# Patient Record
Sex: Male | Born: 1955
Health system: Southern US, Community
[De-identification: ages and names within clinical notes are randomized; demographics above are authoritative.]

## PROBLEM LIST (undated history)

## (undated) ENCOUNTER — Emergency Department (HOSPITAL_COMMUNITY): Admission: EM | Disposition: A | Discharge status: Home / Self Care | Payer: Self-pay

## (undated) DIAGNOSIS — I498 Other specified cardiac arrhythmias: Secondary | ICD-10-CM

## (undated) DIAGNOSIS — Z950 Presence of cardiac pacemaker: Secondary | ICD-10-CM

## (undated) DIAGNOSIS — H6690 Otitis media, unspecified, unspecified ear: Secondary | ICD-10-CM

## (undated) DIAGNOSIS — Z87891 Personal history of nicotine dependence: Secondary | ICD-10-CM

## (undated) DIAGNOSIS — M479 Spondylosis, unspecified: Secondary | ICD-10-CM

## (undated) DIAGNOSIS — F4310 Post-traumatic stress disorder, unspecified: Secondary | ICD-10-CM

## (undated) DIAGNOSIS — H7090 Unspecified mastoiditis, unspecified ear: Secondary | ICD-10-CM

## (undated) DIAGNOSIS — Z77122 Contact with and (suspected) exposure to noise: Secondary | ICD-10-CM

## (undated) HISTORY — DX: Unspecified mastoiditis, unspecified ear: H70.90

## (undated) HISTORY — DX: Contact with and (suspected) exposure to noise: Z77.122

## (undated) HISTORY — DX: Spondylosis, unspecified: M47.9

## (undated) HISTORY — DX: Otitis media, unspecified, unspecified ear: H66.90

## (undated) HISTORY — DX: Personal history of nicotine dependence: Z87.891

## (undated) HISTORY — DX: Post-traumatic stress disorder, unspecified: F43.10

## (undated) HISTORY — PX: OTHER SURGICAL HISTORY: SHX169

## (undated) HISTORY — DX: Other specified cardiac arrhythmias: I49.8

## (undated) HISTORY — DX: Presence of cardiac pacemaker: Z95.0

## (undated) HISTORY — PX: INNER EAR SURGERY: SHX679

---

## 1997-05-16 ENCOUNTER — Ambulatory Visit (HOSPITAL_COMMUNITY): Admission: RE | Admit: 1997-05-16 | Discharge: 1997-05-16 | Payer: Self-pay | Admitting: Family Medicine

## 1997-06-16 ENCOUNTER — Emergency Department (HOSPITAL_COMMUNITY): Admission: EM | Admit: 1997-06-16 | Discharge: 1997-06-16 | Payer: Self-pay | Admitting: Emergency Medicine

## 1997-06-22 ENCOUNTER — Emergency Department (HOSPITAL_COMMUNITY): Admission: EM | Admit: 1997-06-22 | Discharge: 1997-06-22 | Payer: Self-pay | Admitting: Emergency Medicine

## 1997-09-11 ENCOUNTER — Ambulatory Visit (HOSPITAL_COMMUNITY): Admission: RE | Admit: 1997-09-11 | Discharge: 1997-09-11 | Payer: Self-pay | Admitting: *Deleted

## 1997-09-14 ENCOUNTER — Ambulatory Visit (HOSPITAL_COMMUNITY): Admission: RE | Admit: 1997-09-14 | Discharge: 1997-09-14 | Payer: Self-pay | Admitting: *Deleted

## 1999-09-03 ENCOUNTER — Emergency Department (HOSPITAL_COMMUNITY): Admission: EM | Admit: 1999-09-03 | Discharge: 1999-09-03 | Payer: Self-pay | Admitting: Emergency Medicine

## 2001-05-22 ENCOUNTER — Encounter: Payer: Self-pay | Admitting: Internal Medicine

## 2001-05-22 ENCOUNTER — Emergency Department (HOSPITAL_COMMUNITY): Admission: EM | Admit: 2001-05-22 | Discharge: 2001-05-22 | Payer: Self-pay | Admitting: Internal Medicine

## 2003-02-14 ENCOUNTER — Encounter: Admission: RE | Admit: 2003-02-14 | Discharge: 2003-02-14 | Payer: Self-pay | Admitting: Family Medicine

## 2004-02-02 ENCOUNTER — Ambulatory Visit: Payer: Self-pay | Admitting: Internal Medicine

## 2004-05-08 ENCOUNTER — Ambulatory Visit: Payer: Self-pay | Admitting: Internal Medicine

## 2004-06-16 ENCOUNTER — Ambulatory Visit: Payer: Self-pay

## 2004-07-01 ENCOUNTER — Ambulatory Visit: Payer: Self-pay | Admitting: Internal Medicine

## 2004-09-08 ENCOUNTER — Ambulatory Visit: Payer: Self-pay | Admitting: Internal Medicine

## 2004-09-12 ENCOUNTER — Emergency Department (HOSPITAL_COMMUNITY): Admission: EM | Admit: 2004-09-12 | Discharge: 2004-09-12 | Payer: Self-pay | Admitting: Emergency Medicine

## 2004-09-12 ENCOUNTER — Ambulatory Visit: Payer: Self-pay

## 2005-02-23 ENCOUNTER — Encounter: Admission: RE | Admit: 2005-02-23 | Discharge: 2005-02-23 | Payer: Self-pay | Admitting: Orthopedic Surgery

## 2005-04-09 ENCOUNTER — Ambulatory Visit: Payer: Self-pay | Admitting: Internal Medicine

## 2005-05-25 ENCOUNTER — Ambulatory Visit: Payer: Self-pay | Admitting: Internal Medicine

## 2005-07-31 ENCOUNTER — Ambulatory Visit: Payer: Self-pay | Admitting: Internal Medicine

## 2005-08-10 ENCOUNTER — Ambulatory Visit: Payer: Self-pay | Admitting: Family Medicine

## 2006-05-31 ENCOUNTER — Ambulatory Visit: Payer: Self-pay | Admitting: Internal Medicine

## 2006-07-05 ENCOUNTER — Encounter (INDEPENDENT_AMBULATORY_CARE_PROVIDER_SITE_OTHER): Payer: Self-pay | Admitting: Otolaryngology

## 2006-07-05 ENCOUNTER — Ambulatory Visit (HOSPITAL_COMMUNITY): Admission: RE | Admit: 2006-07-05 | Discharge: 2006-07-06 | Payer: Self-pay | Admitting: Otolaryngology

## 2006-09-01 ENCOUNTER — Ambulatory Visit: Payer: Self-pay | Admitting: Internal Medicine

## 2007-02-08 ENCOUNTER — Ambulatory Visit: Payer: Self-pay | Admitting: Internal Medicine

## 2007-05-31 ENCOUNTER — Ambulatory Visit: Payer: Self-pay | Admitting: Internal Medicine

## 2007-09-12 ENCOUNTER — Emergency Department (HOSPITAL_COMMUNITY): Admission: EM | Admit: 2007-09-12 | Discharge: 2007-09-12 | Payer: Self-pay | Admitting: Family Medicine

## 2007-10-06 ENCOUNTER — Ambulatory Visit: Payer: Self-pay | Admitting: Internal Medicine

## 2008-01-23 ENCOUNTER — Ambulatory Visit: Payer: Self-pay | Admitting: Internal Medicine

## 2008-04-20 ENCOUNTER — Encounter (INDEPENDENT_AMBULATORY_CARE_PROVIDER_SITE_OTHER): Payer: Self-pay

## 2008-05-17 ENCOUNTER — Encounter: Payer: Self-pay | Admitting: Internal Medicine

## 2008-06-05 DIAGNOSIS — Z950 Presence of cardiac pacemaker: Secondary | ICD-10-CM | POA: Insufficient documentation

## 2008-06-05 DIAGNOSIS — H7090 Unspecified mastoiditis, unspecified ear: Secondary | ICD-10-CM | POA: Insufficient documentation

## 2008-06-05 DIAGNOSIS — M479 Spondylosis, unspecified: Secondary | ICD-10-CM | POA: Insufficient documentation

## 2008-06-05 DIAGNOSIS — I498 Other specified cardiac arrhythmias: Secondary | ICD-10-CM | POA: Insufficient documentation

## 2008-06-05 DIAGNOSIS — Z87891 Personal history of nicotine dependence: Secondary | ICD-10-CM | POA: Insufficient documentation

## 2008-06-14 ENCOUNTER — Ambulatory Visit: Payer: Self-pay | Admitting: Internal Medicine

## 2008-07-09 ENCOUNTER — Ambulatory Visit: Payer: Self-pay | Admitting: Internal Medicine

## 2008-07-09 DIAGNOSIS — G4700 Insomnia, unspecified: Secondary | ICD-10-CM | POA: Insufficient documentation

## 2008-08-13 ENCOUNTER — Ambulatory Visit: Payer: Self-pay | Admitting: Internal Medicine

## 2008-08-13 DIAGNOSIS — R05 Cough: Secondary | ICD-10-CM | POA: Insufficient documentation

## 2008-08-13 DIAGNOSIS — N401 Enlarged prostate with lower urinary tract symptoms: Secondary | ICD-10-CM | POA: Insufficient documentation

## 2008-08-13 DIAGNOSIS — R059 Cough, unspecified: Secondary | ICD-10-CM | POA: Insufficient documentation

## 2008-08-13 LAB — CONVERTED CEMR LAB
ALT: 27 units/L (ref 0–53)
Albumin: 4.6 g/dL (ref 3.5–5.2)
Alkaline Phosphatase: 46 units/L (ref 39–117)
Bilirubin Urine: NEGATIVE
Bilirubin, Direct: 0.2 mg/dL (ref 0.0–0.3)
Chloride: 105 meq/L (ref 96–112)
Creatinine, Ser: 0.9 mg/dL (ref 0.4–1.5)
Eosinophils Absolute: 0.1 10*3/uL (ref 0.0–0.7)
GFR calc non Af Amer: 93.92 mL/min (ref >60)
Hemoglobin: 16.7 g/dL (ref 13.0–17.0)
Ketones, ur: NEGATIVE mg/dL
LDL Cholesterol: 103 mg/dL — ABNORMAL HIGH (ref 0–99)
Lymphocytes Relative: 19.6 % (ref 12.0–46.0)
Lymphs Abs: 1.9 10*3/uL (ref 0.7–4.0)
MCHC: 35.3 g/dL (ref 30.0–36.0)
MCV: 94.7 fL (ref 78.0–100.0)
Monocytes Relative: 8.3 % (ref 3.0–12.0)
Neutro Abs: 7 10*3/uL (ref 1.4–7.7)
Neutrophils Relative %: 70.8 % (ref 43.0–77.0)
Specific Gravity, Urine: 1.025 (ref 1.000–1.030)
Total CHOL/HDL Ratio: 3
Total Protein: 7.3 g/dL (ref 6.0–8.3)
Urine Glucose: NEGATIVE mg/dL
Urobilinogen, UA: 0.2 (ref 0.0–1.0)

## 2008-08-14 ENCOUNTER — Encounter: Payer: Self-pay | Admitting: Internal Medicine

## 2008-09-21 ENCOUNTER — Encounter: Payer: Self-pay | Admitting: Internal Medicine

## 2008-11-13 ENCOUNTER — Encounter (INDEPENDENT_AMBULATORY_CARE_PROVIDER_SITE_OTHER): Payer: Self-pay | Admitting: *Deleted

## 2008-11-18 ENCOUNTER — Encounter: Payer: Self-pay | Admitting: Internal Medicine

## 2008-11-19 ENCOUNTER — Ambulatory Visit: Payer: Self-pay | Admitting: Internal Medicine

## 2008-11-29 ENCOUNTER — Encounter: Payer: Self-pay | Admitting: Internal Medicine

## 2009-01-22 ENCOUNTER — Encounter (INDEPENDENT_AMBULATORY_CARE_PROVIDER_SITE_OTHER): Payer: Self-pay | Admitting: *Deleted

## 2009-03-08 ENCOUNTER — Encounter: Payer: Self-pay | Admitting: Internal Medicine

## 2009-03-25 ENCOUNTER — Encounter: Payer: Self-pay | Admitting: Internal Medicine

## 2009-06-14 ENCOUNTER — Ambulatory Visit: Payer: Self-pay | Admitting: Internal Medicine

## 2009-09-13 ENCOUNTER — Encounter (INDEPENDENT_AMBULATORY_CARE_PROVIDER_SITE_OTHER): Payer: Self-pay | Admitting: *Deleted

## 2010-01-08 ENCOUNTER — Encounter: Payer: Self-pay | Admitting: Internal Medicine

## 2010-01-14 ENCOUNTER — Encounter: Payer: Self-pay | Admitting: Internal Medicine

## 2010-02-11 NOTE — Letter (Signed)
Summary: Device-Delinquent Phone Journalist, newspaper, Main Office  [address]   [place], [place] [address]   Phone: [phone]  Fax: [phone]     March 25, 2009 MRN: [id number]   [facility] [address] [place], [place]  [address]   Dear [name],  According to our records, you were scheduled for a device phone transmission on February 20, 2009.     We did not receive any results from this check.  If you transmitted on your scheduled day, please call us to help troubleshoot your system.  If you forgot to send your transmission, please send one upon receipt of this letter.  Thank you,   Architectural technologist [facility]

## 2010-02-11 NOTE — Letter (Signed)
Summary: Device-Delinquent Phone Journalist, newspaper, Main Office  [address]   [place], [place] [address]   Phone: [phone]  Fax: [phone]     September 13, 2009 MRN: [id number]   Nicholas [name] [address] [place], [place]  [address]   Dear Mr. TORAL,  According to our records, you were scheduled for a device phone transmission on 09/12/09.                             .     We did not receive any results from this check.  If you transmitted on your scheduled day, please call us to help troubleshoot your system.  If you forgot to send your transmission, please send one upon receipt of this letter.  Thank you,  Altha Harm, LPN  September 13, 2009 3:50 PM  [facility]

## 2010-02-11 NOTE — Letter (Signed)
Summary: Referral - not able to see patient  [facility]  [address] [place], [place] [address]   Phone: [phone]  Fax: [phone]        January 22, 2009   Re:   Nicholas [name] DOB:  25-Mar-1955 MRN:   [id number]    Dear Dr. Sanda Linger:  Thank you for your kind referral of the above patient.  We have attempted to schedule the recommended procedure for a Screening Colonscopy but have not been able to schedule because:   X  The patient was not available by phone and/or has not returned our calls.  ___ The patient declined to schedule the procedure at this time.  We appreciate the referral and hope that we will have the opportunity to treat this patient in the future.    Sincerely,  [facility] [phone]

## 2010-02-11 NOTE — Cardiovascular Report (Signed)
Summary: Office Visit   [facility]   Imported By: Roderic Ovens 06/25/2009 10:55:22  _____________________________________________________________________  External Attachment:    Type:   Image     Comment:   External Document

## 2010-02-11 NOTE — Assessment & Plan Note (Signed)
Summary: pacer check.[email or web address].amber   Visit Type:  Follow-up Primary Provider:  Etta Grandchild MD   History of Present Illness: Mr. Telford returns today for PPM followup.  He has a h/o syncope and bradycardia and underwent PPM at [facility]  several yrs ago.  He has been stable.  He denies c/p or sob.  He does have cough and HA.  No fever, chills, or night sweats.  Current Medications (verified): 1)  Trazodone Hcl 50 Mg Tabs (Trazodone Hcl) .... One By Mouth Qhs For Insomnia  Allergies (verified): No Known Drug Allergies  Past History:  Past Medical History: Last updated: 06/05/2008 TOBACCO ABUSE, HX OF (ICD-V15.82) EXPOSURE TO NOISE (ICD-E928.1) SPONDYLOSIS UNSPEC SITE W/O MENTION MYELOPATHY (ICD-721.90) BRADYCARDIA (ICD-427.89) CARDIAC PACEMAKER IN SITU (ICD-V45.01) MASTOIDITIS (ICD-383.9) OTITIS MEDIA (ICD-382.9) PTSD  Past Surgical History: Last updated: 10/19/2006 Permanent pacemaker  Vital Signs:  Patient profile:   55 year old male Height:      72 inches Weight:      140 pounds BMI:     19.06 Pulse rate:   72 / minute BP sitting:   122 / 70  (left arm)  Vitals Entered By: Laurance Flatten CMA (June 14, 2009 10:49 AM)  Physical Exam  General:  alert, well-developed, well-nourished, well-hydrated, healthy-appearing, cooperative to examination, and good hygiene.   Head:  normocephalic and atraumatic.   Eyes:  vision grossly intact, pupils equal, and pupils round.   Ears:  R ear normal and L ear normal.   Mouth:  Oral mucosa and oropharynx without lesions or exudates.  Teeth in good repair. Neck:  supple, full ROM, no masses, normal carotid upstroke, no carotid bruits, no cervical lymphadenopathy, and no neck tenderness.   Chest Wall:  Well healed PPM incision. Lungs:  Normal respiratory effort, chest expands symmetrically. Lungs are clear to auscultation, no crackles or wheezes. Heart:  Normal rate and regular rhythm. S1 and S2 normal without gallop, murmur,  click, rub or other extra sounds. Abdomen:  Bowel sounds positive,abdomen soft and non-tender without masses, organomegaly or hernias noted. Msk:  No deformity or scoliosis noted of thoracic or lumbar spine.   Pulses:  R and L carotid,radial,femoral,dorsalis pedis and posterior tibial pulses are full and equal bilaterally Extremities:  No clubbing, cyanosis, edema, or deformity noted with normal full range of motion of all joints.   Neurologic:  Alert and oriented x 3.   PPM Specifications Following MD:  Lewayne Bunting, MD     PPM Vendor:  Medtronic     PPM Model Number:  [id number]     PPM Serial Number:  [id number] PPM DOI:  06/07/2001     PPM Implanting MD:  Lewayne Bunting, MD  Lead 1    Location: RA     DOI: 06/07/2001     Model #: [id number]     Serial #: [id number]     Status: active Lead 2    Location: RV     DOI: 06/07/2001     Model #: [id number]     Serial #: [id number]     Status: active   Indications:  Bradycardia   PPM Follow Up Battery Voltage:  2.72 V     Battery Est. Longevity:  25 MTHS     Pacer Dependent:  No       PPM Device Measurements Atrium  Amplitude: 5.60 mV, Impedance: 444 ohms, Threshold: 0.50 V at 0.40 msec Right Ventricle  Amplitude: 11.20 mV, Impedance: 592 ohms, Threshold: 0.50 V  at 0.40 msec  Episodes MS Episodes:  0     Percent Mode Switch:  0     Ventricular High Rate:  42     Atrial Pacing:  3.9%     Ventricular Pacing:  <0.1%  Parameters Mode:  DDDR     Lower Rate Limit:  40     Upper Rate Limit:  130 Paced AV Delay:  250     Sensed AV Delay:  230 Tech Comments:  42 VHR EPISODES IN OCT-NOV 2010--HUNTING SEASON.  MOST EPISODES 1:1.  NORMAL SENSING AND THRESHOLD TESTING.  BATTERY LONGEVITY 25 MTHS.  NO CHANGES MADE.  Vella Kohler  June 14, 2009 11:16 AM MD Comments:  Agree with above.  Likely sinus tachycardia.  Impression & Recommendations:  Problem # 1:  CARDIAC PACEMAKER IN SITU (ICD-V45.01) His PPM is working normally.  He is about 2 yrs out from PM  generator change.  Problem # 2:  TOBACCO ABUSE, HX OF (ICD-V15.82) I have encouraged him to stop smoking.    Problem # 3:  INSOMNIA (ICD-780.52) this appears to be improved.  Patient Instructions: 1)  Your physician recommends that you schedule a follow-up appointment in: 12 months with Dr Ladona Ridgel

## 2010-02-11 NOTE — Letter (Signed)
Summary: Device-Delinquent Phone Journalist, newspaper, Main Office  [address]   [place], [place] [address]   Phone: [phone]  Fax: [phone]     March 08, 2009 MRN: [id number]   KAIYDEN SIMKIN [address] [place], [place]  [address]   Dear Mr. MARINELLO,  According to our records, you were scheduled for a device phone transmission on    February 20, 2009.     We did not receive any results from this check.  If you transmitted on your scheduled day, please call us to help troubleshoot your system.  If you forgot to send your transmission, please send one upon receipt of this letter.  Thank you,   Architectural technologist [facility]

## 2010-02-13 NOTE — Letter (Signed)
Summary: Device-Delinquent Phone Journalist, newspaper, Main Office  [address]   [place], [place] [address]   Phone: [phone]  Fax: [phone]     January 08, 2010 MRN: [id number]   CAYLEN YARDLEY [address] [place], [place]  [address]   Dear Mr. LACOCK,  According to our records, you were scheduled for a device phone transmission on  09-12-2009.     We did not receive any results from this check.  If you transmitted on your scheduled day, please call us to help troubleshoot your system.  If you forgot to send your transmission, please send one upon receipt of this letter.  Thank you,  Vella Kohler  January 08, 2010 3:20 PM   [facility] certified

## 2010-02-13 NOTE — Cardiovascular Report (Signed)
Summary: Certified Letter Signed - Patient (No Appt. No Response)  Certified Letter Signed - Patient (No Appt. No Response)   Imported By: Debby Freiberg 02/07/2010 12:41:03  _____________________________________________________________________  External Attachment:    Type:   Image     Comment:   External Document
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
## 2010-05-27 NOTE — Assessment & Plan Note (Signed)
[facility]                         ELECTROPHYSIOLOGY OFFICE NOTE   NAME:Nicholas [name], Nicholas [name]                         MRN:          [id number]  DATE:05/31/2007                            DOB:          08-14-1955    Nicholas [name] returns today for followup.  He is a very pleasant middle-aged  man with symptomatic bradycardia thought secondary to a neurally  mediated mechanism for which he underwent a permanent pacemaker  insertion approximately 6 years ago.  He returns today for followup.  He  has been stable.  He does note that he still has discomfort at the  pacemaker insertion site but otherwise has had no specific complaints  from the device except that he still cannot stand to look at it or  touch it.   CURRENT MEDICATIONS:  He is on no medicines.   PHYSICAL EXAMINATION:  GENERAL:  He is a pleasant, well-appearing middle-  aged man in no distress.  VITAL SIGNS:  Blood pressure was 104/71, the pulse 70 and regular,  respirations were 18.  Weight was 148 pounds.  NECK:  No jugular venous distention.  LUNGS:  Clear bilaterally to auscultation.  No wheezes, rales or rhonchi  are present.  CARDIOVASCULAR:  Regular rate and rhythm.  Normal S1-S2.  EXTREMITIES:  No cyanosis, clubbing or edema.   Interrogation of his pacemaker demonstrates a Medtronic Kappa 900.  P  waves were greater than 5, R waves were 11.  The impedance was 438 in  the atrium, 660 in the ventricle.  Threshold was 0.5 at 0.4 both in the  right atrium and right ventricle.  Battery voltage was 2.76 volts.  Estimated longevity was 3-1/2 years.  He had 4 mode switching episodes,  the lower rate was 40 per minute.  The patient was A pacing 3% of the  time, V pacing less than 0.1% of the time.   IMPRESSION:  1. Symptomatic bradycardia.  2. Neurally mediated syncope.  3. Status post pacemaker insertion.   DISCUSSION:  Overall, Nicholas [name] is stable.  His pacemaker is working  normally.  Will see  him back for pacemaker followup in 1 years.     Doylene Canning. Ladona Ridgel, MD  Electronically Signed    GWT/MedQ  DD: 05/31/2007  DT: 05/31/2007  Job #: [id number]
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
## 2010-06-19 ENCOUNTER — Encounter: Payer: Self-pay | Admitting: Internal Medicine

## 2010-07-24 ENCOUNTER — Ambulatory Visit (INDEPENDENT_AMBULATORY_CARE_PROVIDER_SITE_OTHER): Payer: BC Managed Care – PPO | Admitting: Internal Medicine

## 2010-07-24 ENCOUNTER — Encounter: Payer: Self-pay | Admitting: Internal Medicine

## 2010-07-24 DIAGNOSIS — Z950 Presence of cardiac pacemaker: Secondary | ICD-10-CM

## 2010-07-24 DIAGNOSIS — I498 Other specified cardiac arrhythmias: Secondary | ICD-10-CM

## 2010-07-24 LAB — PACEMAKER DEVICE OBSERVATION
AL IMPEDENCE PM: 543 Ohm
AL THRESHOLD: 0.5 V
ATRIAL PACING PM: 5
RV LEAD THRESHOLD: 0.5 V
VENTRICULAR PACING PM: 0

## 2010-07-24 NOTE — Progress Notes (Signed)
HPI Mr. Tappan returns today for followup. He is a pleasant 55 yo man with a h/o neurally mediated syncope, s/p PPM. The patient has had ongoing pain at his PPM insertion site. No additional syncope. No c/p. He has multiple chronic complaints including HA, multiple arthritic complaints and generalized fatigue.  No Known Allergies   No current outpatient prescriptions on file.     Past Medical History  Diagnosis Date  . Personal history of tobacco use, presenting hazards to health   . Exposure to noise   . Spondylosis of unspecified site without mention of myelopathy   . Other specified cardiac dysrhythmias     bradycardia  . Cardiac pacemaker in situ   . Unspecified mastoiditis   . Unspecified otitis media   . PTSD (post-traumatic stress disorder)     ROS:   All systems reviewed and negative except as noted in the HPI.   Past Surgical History  Procedure Date  . Permanent pacemaker      No family history on file.   History   Social History  . Marital Status: Married    Spouse Name: N/A    Number of Children: N/A  . Years of Education: N/A   Occupational History  . Not on file.   Social History Main Topics  . Smoking status: Smoker, Current Status Unknown  . Smokeless tobacco: Not on file  . Alcohol Use: No  . Drug Use: No  . Sexually Active: Not on file   Other Topics Concern  . Not on file   Social History Narrative   Married, does not get regular exercise. 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 Has a HS education.       BP 108/65  Pulse 70  Resp 18  Ht 6' (1.829 m)  Wt 142 lb 1.9 oz (64.465 kg)  BMI 19.27 kg/m2  Physical Exam:  Well appearing NAD HEENT: Unremarkable Neck:  No JVD, no thyromegally Lymphatics:  No adenopathy Back:  No CVA tenderness Lungs:  Clear. Well healed PPM incision. HEART:  Regular rate rhythm, no murmurs, no rubs, no clicks Abd:  soft, positive bowel sounds, no organomegally, no  rebound, no guarding Ext:  2 plus pulses, no edema, no cyanosis, no clubbing Skin:  No rashes no nodules Neuro:  CN II through XII intact, motor grossly intact  DEVICE  Normal device function.  See PaceArt for details.   Assess/Plan:

## 2010-07-24 NOTE — Assessment & Plan Note (Signed)
His device is working normally. He has a year of battery left on his device. He is unsure if he will have it changed out.

## 2010-07-24 NOTE — Patient Instructions (Signed)
Your physician wants you to follow-up in: 12 months Dr Taylor You will receive a reminder letter in the mail two months in advance. If you don't receive a letter, please call our office to schedule the follow-up appointment.  Remote monitoring is used to monitor your Pacemaker of ICD from home. This monitoring reduces the number of office visits required to check your device to one time per year. It allows us to keep an eye on the functioning of your device to ensure it is working properly. You are scheduled for a device check from home on 10/23/2010. You may send your transmission at any time that day. If you have a wireless device, the transmission will be sent automatically. After your physician reviews your transmission, you will receive a postcard with your next transmission date.   

## 2010-10-23 ENCOUNTER — Encounter: Payer: BC Managed Care – PPO | Admitting: *Deleted

## 2010-10-27 ENCOUNTER — Encounter: Payer: Self-pay | Admitting: *Deleted

## 2010-10-29 LAB — DIFFERENTIAL
Eosinophils Absolute: 0.1
Eosinophils Relative: 1
Lymphocytes Relative: 24
Lymphs Abs: 2.1
Monocytes Absolute: 0.8 — ABNORMAL HIGH
Monocytes Relative: 9
Neutrophils Relative %: 66

## 2010-10-29 LAB — PROTIME-INR
INR: 1.1
Prothrombin Time: 14.7

## 2010-10-29 LAB — COMPREHENSIVE METABOLIC PANEL
Albumin: 4.5
BUN: 5 — ABNORMAL LOW
GFR calc Af Amer: >60
Glucose, Bld: 84
Potassium: 4.3
Total Protein: 6.6

## 2010-10-29 LAB — CBC
HCT: 44.9
Hemoglobin: 15.2
RDW: 12.8

## 2010-10-29 LAB — URINALYSIS, ROUTINE W REFLEX MICROSCOPIC
Ketones, ur: NEGATIVE
Leukocytes, UA: NEGATIVE
Protein, ur: NEGATIVE
Urobilinogen, UA: 0.2
pH: 7.5

## 2011-07-22 ENCOUNTER — Emergency Department (HOSPITAL_COMMUNITY)
Admission: EM | Admit: 2011-07-22 | Discharge: 2011-07-22 | Disposition: A | Discharge status: Home / Self Care | Payer: BC Managed Care – PPO | Source: Home / Self Care | Attending: Emergency Medicine | Admitting: Emergency Medicine

## 2011-07-22 ENCOUNTER — Encounter (HOSPITAL_COMMUNITY): Payer: Self-pay | Admitting: Emergency Medicine

## 2011-07-22 DIAGNOSIS — T148XXA Other injury of unspecified body region, initial encounter: Secondary | ICD-10-CM

## 2011-07-22 DIAGNOSIS — W57XXXA Bitten or stung by nonvenomous insect and other nonvenomous arthropods, initial encounter: Secondary | ICD-10-CM

## 2011-07-22 DIAGNOSIS — I951 Orthostatic hypotension: Secondary | ICD-10-CM

## 2011-07-22 DIAGNOSIS — T148 Other injury of unspecified body region: Secondary | ICD-10-CM

## 2011-07-22 LAB — COMPREHENSIVE METABOLIC PANEL
ALT: 24 U/L (ref 0–53)
AST: 27 U/L (ref 0–37)
Albumin: 4.6 g/dL (ref 3.5–5.2)
CO2: 26 mEq/L (ref 19–32)
Creatinine, Ser: 0.81 mg/dL (ref 0.50–1.35)
Glucose, Bld: 94 mg/dL (ref 70–99)
Potassium: 4.3 mEq/L (ref 3.5–5.1)
Total Bilirubin: 0.6 mg/dL (ref 0.3–1.2)

## 2011-07-22 LAB — CBC WITH DIFFERENTIAL/PLATELET
Basophils Absolute: 0.1 10*3/uL (ref 0.0–0.1)
Basophils Relative: 1 % (ref 0–1)
Lymphs Abs: 2.1 10*3/uL (ref 0.7–4.0)
MCH: 32.3 pg (ref 26.0–34.0)
MCV: 93.2 fL (ref 78.0–100.0)
Monocytes Relative: 11 % (ref 3–12)
RBC: 4.7 MIL/uL (ref 4.22–5.81)
RDW: 13.1 % (ref 11.5–15.5)
WBC: 9.5 10*3/uL (ref 4.0–10.5)

## 2011-07-22 LAB — POCT URINALYSIS DIP (DEVICE)
Ketones, ur: NEGATIVE mg/dL
Protein, ur: NEGATIVE mg/dL
pH: 6 (ref 5.0–8.0)

## 2011-07-22 MED ORDER — DOXYCYCLINE HYCLATE 100 MG PO TABS: 100.0000 mg | ORAL_TABLET | Freq: Two times a day (BID) | ORAL | Status: AC

## 2011-07-22 NOTE — ED Notes (Signed)
Onset 2 weeks ago of feeling bad.  Denies any uri, denies fever, denies weight loss.  Patient is eating and drinking like usual.  Denies any diarrhea, no vomiting.  Patient describes a gradual increasing weakness, dizziness, just "feeling bad". Intermittent nausea.   Patient reports removing several ticks over the season.  Patient removed one from left lower leg, firm and very red around wound.

## 2011-07-22 NOTE — ED Provider Notes (Signed)
Medical screening examination/treatment/procedure(s) were performed by a resident physician and as supervising physician I was immediately available for consultation/collaboration.  Additionally, I saw the patient independently, verified the history, examined the patient and discussed the treatment plan with the resident.  Leslee Home, M.D.    Reuben Likes, MD 07/22/11 2114

## 2011-07-22 NOTE — ED Provider Notes (Signed)
Medical screening examination/treatment/procedure(s) were performed by a resident physician and as supervising physician I was immediately available for consultation/collaboration.  I talked with the patient and examined him.  I concur with the treatment as outlined below.   Leslee Home, M.D.   Reuben Likes, MD 07/22/11 [id number]
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
## 2011-07-28 ENCOUNTER — Encounter: Payer: Self-pay | Admitting: Internal Medicine

## 2011-07-28 ENCOUNTER — Encounter: Payer: Self-pay | Admitting: *Deleted

## 2011-07-28 ENCOUNTER — Ambulatory Visit (INDEPENDENT_AMBULATORY_CARE_PROVIDER_SITE_OTHER): Payer: BC Managed Care – PPO | Admitting: Internal Medicine

## 2011-07-28 VITALS — BP 120/70 | HR 72 | Ht 72.0 in | Wt 143.0 lb

## 2011-07-28 DIAGNOSIS — Z950 Presence of cardiac pacemaker: Secondary | ICD-10-CM

## 2011-07-28 DIAGNOSIS — I498 Other specified cardiac arrhythmias: Secondary | ICD-10-CM

## 2011-07-28 LAB — PACEMAKER DEVICE OBSERVATION
BATTERY VOLTAGE: 2.58 v
BMOD-0003RV: 30
BMOD-0005RV: 95 {beats}/min
BRDY-0002RV: 65 {beats}/min
RV LEAD AMPLITUDE: 8 mv
RV LEAD IMPEDENCE PM: 576 Ohm
RV LEAD THRESHOLD: 0.5 v

## 2011-07-28 NOTE — Progress Notes (Signed)
HPI [name] returns today for followup. He is a 55-year-old man with a history of symptomatic bradycardia thought secondary to autonomic dysfunction. He underwent permanent pacemaker insertion at [facility] just over 10 years ago. The patient has not paced in any significant amount since then. He is now reached elective replacement. For several years after his pacemaker was placed, he had problems excepting that there was a foreign body inside of him. He has gradually gotten better with this concept. He denies syncope or peripheral edema. He is working. No Known Allergies   Current Outpatient Prescriptions  Medication Sig Dispense Refill  . doxycycline (VIBRA-TABS) 100 MG tablet Take 1 tablet (100 mg total) by mouth 2 (two) times daily.  14 tablet  0     Past Medical History  Diagnosis Date  . Personal history of tobacco use, presenting hazards to health   . Exposure to noise   . Spondylosis of unspecified site without mention of myelopathy   . Other specified cardiac dysrhythmias     bradycardia  . Cardiac pacemaker in situ   . Unspecified mastoiditis   . Unspecified otitis media   . PTSD (post-traumatic stress disorder)     ROS:   All systems reviewed and negative except as noted in the HPI.   Past Surgical History  Procedure Date  . Permanent pacemaker   . Inner ear surgery      No family history on file.   History   Social History  . Marital Status: Married    Spouse Name: N/A    Number of Children: N/A  . Years of Education: N/A   Occupational History  . Not on file.   Social History Main Topics  . Smoking status: Smoker, Current Status Unknown  . Smokeless tobacco: Not on file  . Alcohol Use: Yes  . Drug Use: No  . Sexually Active: Not on file   Other Topics Concern  . Not on file   Social History Narrative   Married, does not get regular exercise. 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 Has a  HS education.       BP 120/70  Pulse 72  Ht 6' (1.829 m)  Wt 143 lb (64.864 kg)  BMI 19.39 kg/m2  Physical Exam:  Well appearing NAD HEENT: Unremarkable Neck:  No JVD, no thyromegally Lungs:  Clear with no wheezes, rales, or rhonchi. HEART:  Regular rate rhythm, no murmurs, no rubs, no clicks Abd:  soft, positive bowel sounds, no organomegally, no rebound, no guarding Ext:  2 plus pulses, no edema, no cyanosis, no clubbing Skin:  No rashes no nodules Neuro:  CN II through XII intact, motor grossly intact  DEVICE  Normal device function.  See PaceArt for details. Medtronic pacemaker at [facility]  Assess/Plan:   

## 2011-07-28 NOTE — Patient Instructions (Addendum)
Your physician has recommended that you have a pacemaker removed.

## 2011-07-28 NOTE — Assessment & Plan Note (Signed)
The patient's pacemaker is at elective replacement. We discussed to treatment options. One option would be to remove the current device in place a new device. A second option would be to remove the device, The leads, and hold off on permanent pacemaker insertion until the patient demonstrated a clear-cut need to have the device in place. The risk, benefits, goals, and expectations of both approaches were discussed with the patient. For now, we will plan to remove the pacemaker and cap the leads, though he may change his mind in the future.

## 2011-07-30 ENCOUNTER — Other Ambulatory Visit: Payer: Self-pay | Admitting: *Deleted

## 2011-07-30 DIAGNOSIS — R001 Bradycardia, unspecified: Secondary | ICD-10-CM

## 2011-07-30 DIAGNOSIS — Z45018 Encounter for adjustment and management of other part of cardiac pacemaker: Secondary | ICD-10-CM

## 2011-08-03 ENCOUNTER — Encounter (HOSPITAL_COMMUNITY): Payer: Self-pay | Admitting: Pharmacy Technician

## 2011-08-05 ENCOUNTER — Other Ambulatory Visit (INDEPENDENT_AMBULATORY_CARE_PROVIDER_SITE_OTHER): Payer: BC Managed Care – PPO

## 2011-08-05 DIAGNOSIS — I498 Other specified cardiac arrhythmias: Secondary | ICD-10-CM

## 2011-08-05 DIAGNOSIS — R001 Bradycardia, unspecified: Secondary | ICD-10-CM

## 2011-08-05 DIAGNOSIS — Z45018 Encounter for adjustment and management of other part of cardiac pacemaker: Secondary | ICD-10-CM

## 2011-08-05 LAB — CBC WITH DIFFERENTIAL/PLATELET
Basophils Relative: 0.3 % (ref 0.0–3.0)
Eosinophils Absolute: 0 10*3/uL (ref 0.0–0.7)
Lymphocytes Relative: 15.1 % (ref 12.0–46.0)
MCHC: 33.4 g/dL (ref 30.0–36.0)
MCV: 96 fl (ref 78.0–100.0)
Monocytes Absolute: 1.2 10*3/uL — ABNORMAL HIGH (ref 0.1–1.0)
Neutrophils Relative %: 75.2 % (ref 43.0–77.0)
Platelets: 212 10*3/uL (ref 150.0–400.0)
RBC: 4.34 Mil/uL (ref 4.22–5.81)
WBC: 13.3 10*3/uL — ABNORMAL HIGH (ref 4.5–10.5)

## 2011-08-05 LAB — BASIC METABOLIC PANEL
Chloride: 101 mEq/L (ref 96–112)
GFR: 97.88 mL/min (ref >60.00)
Potassium: 3.6 mEq/L (ref 3.5–5.1)
Sodium: 136 mEq/L (ref 135–145)

## 2011-08-11 MED ORDER — SODIUM CHLORIDE 0.9 % IR SOLN
80.0000 mg | Status: DC
  Filled 2011-08-11 (×2): qty 2

## 2011-08-11 MED ORDER — SODIUM CHLORIDE 0.9 % IV SOLN
250.0000 mL | INTRAVENOUS | Status: DC
  Administered 2011-08-12: 20 mL via INTRAVENOUS

## 2011-08-11 MED ORDER — CHLORHEXIDINE GLUCONATE 4 % EX LIQD
60.0000 mL | Freq: Once | CUTANEOUS | Status: DC
  Filled 2011-08-11: qty 60

## 2011-08-11 MED ORDER — SODIUM CHLORIDE 0.9 % IJ SOLN: 3.0000 mL | INTRAMUSCULAR | Status: DC | PRN

## 2011-08-11 MED ORDER — CEFAZOLIN SODIUM-DEXTROSE 2-3 GM-% IV SOLR
2.0000 g | INTRAVENOUS | Status: DC
  Filled 2011-08-11 (×2): qty 50

## 2011-08-11 MED ORDER — SODIUM CHLORIDE 0.9 % IJ SOLN: 3.0000 mL | Freq: Two times a day (BID) | INTRAMUSCULAR | Status: DC

## 2011-08-12 ENCOUNTER — Ambulatory Visit (HOSPITAL_COMMUNITY)
Admission: RE | Admit: 2011-08-12 | Discharge: 2011-08-12 | Disposition: A | Discharge status: Home / Self Care | Payer: BC Managed Care – PPO | Source: Ambulatory Visit | Attending: Internal Medicine | Admitting: Internal Medicine

## 2011-08-12 ENCOUNTER — Encounter (HOSPITAL_COMMUNITY)
Admission: RE | Disposition: A | Discharge status: Home / Self Care | Payer: Self-pay | Source: Ambulatory Visit | Attending: Internal Medicine

## 2011-08-12 ENCOUNTER — Ambulatory Visit (HOSPITAL_COMMUNITY): Payer: BC Managed Care – PPO

## 2011-08-12 DIAGNOSIS — Z950 Presence of cardiac pacemaker: Secondary | ICD-10-CM

## 2011-08-12 DIAGNOSIS — Z45018 Encounter for adjustment and management of other part of cardiac pacemaker: Secondary | ICD-10-CM

## 2011-08-12 DIAGNOSIS — R001 Bradycardia, unspecified: Secondary | ICD-10-CM

## 2011-08-12 HISTORY — PX: GENERATOR REMOVAL: SHX5468

## 2011-08-12 SURGERY — REMOVAL, PULSE GENERATOR, ICD
Anesthesia: LOCAL

## 2011-08-12 MED ORDER — MIDAZOLAM HCL 5 MG/5ML IJ SOLN
INTRAMUSCULAR | Status: AC
  Filled 2011-08-12: qty 5

## 2011-08-12 MED ORDER — FENTANYL CITRATE 0.05 MG/ML IJ SOLN
INTRAMUSCULAR | Status: AC
  Filled 2011-08-12: qty 2

## 2011-08-12 MED ORDER — CEFAZOLIN SODIUM 1-5 GM-% IV SOLN
INTRAVENOUS | Status: AC
  Filled 2011-08-12: qty 100

## 2011-08-12 MED ORDER — ACETAMINOPHEN 325 MG PO TABS
325.0000 mg | ORAL_TABLET | ORAL | Status: DC | PRN
  Filled 2011-08-12: qty 2

## 2011-08-12 MED ORDER — ONDANSETRON HCL 4 MG/2ML IJ SOLN: 4.0000 mg | Freq: Four times a day (QID) | INTRAMUSCULAR | Status: DC | PRN

## 2011-08-12 MED ORDER — LIDOCAINE HCL (PF) 1 % IJ SOLN
INTRAMUSCULAR | Status: AC
  Filled 2011-08-12: qty 60

## 2011-08-12 MED ORDER — HYDROCODONE-ACETAMINOPHEN 5-325 MG PO TABS
1.0000 | ORAL_TABLET | ORAL | Status: DC | PRN
  Administered 2011-08-12: 2 via ORAL
  Filled 2011-08-12: qty 2

## 2011-08-12 MED ORDER — MUPIROCIN 2 % EX OINT
TOPICAL_OINTMENT | Freq: Two times a day (BID) | CUTANEOUS | Status: DC
  Filled 2011-08-12: qty 22

## 2011-08-12 MED ORDER — MUPIROCIN 2 % EX OINT
TOPICAL_OINTMENT | CUTANEOUS | Status: AC
  Filled 2011-08-12: qty 22

## 2011-08-12 NOTE — Interval H&P Note (Signed)
History and Physical Interval Note: Since prior clinic visit no change. Because he has not done much pacing, has a narrow QRS and PR interval, no indication to replace PPM.   08/12/2011 8:23 AM  Nicholas [name]  has presented today for surgery, with the diagnosis of eol  The various methods of treatment have been discussed with the patient and family. After consideration of risks, benefits and other options for treatment, the patient has consented to  Procedure(s) (LRB): GENERATOR REMOVAL (N/A) as a surgical intervention .  The patient's history has been reviewed, patient examined, no change in status, stable for surgery.  I have reviewed the patient's chart and labs.  Questions were answered to the patient's satisfaction.     Lewayne Bunting

## 2011-08-12 NOTE — Op Note (Signed)
DDD PPM removed and PPM leads placed under the subpectoralis muscle without immediate complication. Z#[id number].

## 2011-08-12 NOTE — H&P (View-Only) (Signed)
HPI Nicholas [name] returns today for followup. He is a 56 year old man with a history of symptomatic bradycardia thought secondary to autonomic dysfunction. He underwent permanent pacemaker insertion at [facility] just over 10 years ago. The patient has not paced in any significant amount since then. He is now reached elective replacement. For several years after his pacemaker was placed, he had problems excepting that there was a foreign body inside of him. He has gradually gotten better with this concept. He denies syncope or peripheral edema. He is working. No Known Allergies   Current Outpatient Prescriptions  Medication Sig Dispense Refill  . doxycycline (VIBRA-TABS) 100 MG tablet Take 1 tablet (100 mg total) by mouth 2 (two) times daily.  14 tablet  0     Past Medical History  Diagnosis Date  . Personal history of tobacco use, presenting hazards to health   . Exposure to noise   . Spondylosis of unspecified site without mention of myelopathy   . Other specified cardiac dysrhythmias     bradycardia  . Cardiac pacemaker in situ   . Unspecified mastoiditis   . Unspecified otitis media   . PTSD (post-traumatic stress disorder)     ROS:   All systems reviewed and negative except as noted in the HPI.   Past Surgical History  Procedure Date  . Permanent pacemaker   . Inner ear surgery      No family history on file.   History   Social History  . Marital Status: Married    Spouse Name: N/A    Number of Children: N/A  . Years of Education: N/A   Occupational History  . Not on file.   Social History Main Topics  . Smoking status: Smoker, Current Status Unknown  . Smokeless tobacco: Not on file  . Alcohol Use: Yes  . Drug Use: No  . Sexually Active: Not on file   Other Topics Concern  . Not on file   Social History Narrative   Married, does not get regular exercise. 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       BP 120/70  Pulse 72  Ht 6' (1.829 m)  Wt 143 lb (64.864 kg)  BMI 19.39 kg/m2  Physical Exam:  Well appearing NAD HEENT: Unremarkable Neck:  No JVD, no thyromegally Lungs:  Clear with no wheezes, rales, or rhonchi. HEART:  Regular rate rhythm, no murmurs, no rubs, no clicks Abd:  soft, positive bowel sounds, no organomegally, no rebound, no guarding Ext:  2 plus pulses, no edema, no cyanosis, no clubbing Skin:  No rashes no nodules Neuro:  CN II through XII intact, motor grossly intact  DEVICE  Normal device function.  See PaceArt for details. Medtronic pacemaker at [facility]  Assess/Plan:
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
## 2011-08-24 ENCOUNTER — Ambulatory Visit (INDEPENDENT_AMBULATORY_CARE_PROVIDER_SITE_OTHER): Payer: BC Managed Care – PPO | Admitting: *Deleted

## 2011-08-24 DIAGNOSIS — I498 Other specified cardiac arrhythmias: Secondary | ICD-10-CM

## 2011-08-24 LAB — PACEMAKER DEVICE OBSERVATION

## 2012-01-30 ENCOUNTER — Emergency Department (HOSPITAL_COMMUNITY): Payer: BC Managed Care – PPO

## 2012-01-30 ENCOUNTER — Emergency Department (HOSPITAL_COMMUNITY)
Admission: EM | Admit: 2012-01-30 | Discharge: 2012-01-30 | Disposition: A | Discharge status: Home / Self Care | Payer: BC Managed Care – PPO | Attending: Emergency Medicine | Admitting: Emergency Medicine

## 2012-01-30 ENCOUNTER — Encounter (HOSPITAL_COMMUNITY): Payer: Self-pay | Admitting: *Deleted

## 2012-01-30 DIAGNOSIS — Y9389 Activity, other specified: Secondary | ICD-10-CM | POA: Insufficient documentation

## 2012-01-30 DIAGNOSIS — S59909A Unspecified injury of unspecified elbow, initial encounter: Secondary | ICD-10-CM | POA: Insufficient documentation

## 2012-01-30 DIAGNOSIS — M25532 Pain in left wrist: Secondary | ICD-10-CM

## 2012-01-30 DIAGNOSIS — Z8739 Personal history of other diseases of the musculoskeletal system and connective tissue: Secondary | ICD-10-CM | POA: Insufficient documentation

## 2012-01-30 DIAGNOSIS — F17200 Nicotine dependence, unspecified, uncomplicated: Secondary | ICD-10-CM | POA: Insufficient documentation

## 2012-01-30 DIAGNOSIS — Y9289 Other specified places as the place of occurrence of the external cause: Secondary | ICD-10-CM | POA: Insufficient documentation

## 2012-01-30 DIAGNOSIS — Z950 Presence of cardiac pacemaker: Secondary | ICD-10-CM | POA: Insufficient documentation

## 2012-01-30 DIAGNOSIS — W298XXA Contact with other powered powered hand tools and household machinery, initial encounter: Secondary | ICD-10-CM | POA: Insufficient documentation

## 2012-01-30 DIAGNOSIS — Z8659 Personal history of other mental and behavioral disorders: Secondary | ICD-10-CM | POA: Insufficient documentation

## 2012-01-30 DIAGNOSIS — S6990XA Unspecified injury of unspecified wrist, hand and finger(s), initial encounter: Secondary | ICD-10-CM | POA: Insufficient documentation

## 2012-01-30 DIAGNOSIS — Z8679 Personal history of other diseases of the circulatory system: Secondary | ICD-10-CM | POA: Insufficient documentation

## 2012-01-30 DIAGNOSIS — Z79899 Other long term (current) drug therapy: Secondary | ICD-10-CM | POA: Insufficient documentation

## 2012-01-30 MED ORDER — OXYCODONE-ACETAMINOPHEN 5-325 MG PO TABS: 1.0000 | ORAL_TABLET | ORAL | Status: DC | PRN

## 2012-01-30 MED ORDER — IBUPROFEN 600 MG PO TABS: 600.0000 mg | ORAL_TABLET | Freq: Three times a day (TID) | ORAL | Status: AC | PRN

## 2012-01-30 MED ORDER — OXYCODONE-ACETAMINOPHEN 5-325 MG PO TABS
2.0000 | ORAL_TABLET | Freq: Once | ORAL | Status: AC
  Administered 2012-01-30: 2 via ORAL
  Filled 2012-01-30: qty 2

## 2012-01-30 NOTE — ED Notes (Signed)
Ortho tech called for splint placement. 

## 2012-01-30 NOTE — ED Notes (Signed)
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

## 2012-01-30 NOTE — ED Provider Notes (Signed)
History     CSN: [id number]  Arrival date & time 01/30/12  0800   First MD Initiated Contact with Patient 01/30/12 [phone]      Chief Complaint  Patient presents with  . Wrist Pain     The history is provided by the patient.   the patient reports injuring his left wrist yesterday while drilling into a concrete wall.  He reports no fever chills.  No numbness.  His pain is worse with movement of his left wrist.  He has no other complaints.  His symptoms are mild to moderate in severity.  Over-the-counter pain medications without improvement in his symptoms. Past Medical History  Diagnosis Date  . Personal history of tobacco use, presenting hazards to health   . Exposure to noise   . Spondylosis of unspecified site without mention of myelopathy   . Other specified cardiac dysrhythmias     bradycardia  . Cardiac pacemaker in situ   . Unspecified mastoiditis   . Unspecified otitis media   . PTSD (post-traumatic stress disorder)     Past Surgical History  Procedure Date  . Permanent pacemaker   . Inner ear surgery     History reviewed. No pertinent family history.  History  Substance Use Topics  . Smoking status: Smoker, Current Status Unknown  . Smokeless tobacco: Not on file  . Alcohol Use: Yes      Review of Systems  All other systems reviewed and are negative.    Allergies  Review of patient's allergies indicates no known allergies.  Home Medications   Current Outpatient Rx  Name  Route  Sig  Dispense  Refill  . MUCINEX PO   Oral   Take 2 tablets by mouth daily as needed. For congestion         . IBUPROFEN 600 MG PO TABS   Oral   Take 1 tablet (600 mg total) by mouth every 8 (eight) hours as needed for pain.   15 tablet   0   . OXYCODONE-ACETAMINOPHEN 5-325 MG PO TABS   Oral   Take 1 tablet by mouth every 4 (four) hours as needed for pain.   20 tablet   0     BP 112/73  Pulse 90  Temp 98.2 F (36.8 C) (Oral)  Resp 18  SpO2 99%  Physical  Exam  Nursing note and vitals reviewed. Constitutional: He is oriented to person, place, and time. He appears well-developed and well-nourished.  HENT:  Head: Normocephalic and atraumatic.  Eyes: EOM are normal.  Neck: Normal range of motion.  Cardiovascular: Normal rate, regular rhythm, normal heart sounds and intact distal pulses.   Pulmonary/Chest: Effort normal and breath sounds normal. No respiratory distress.  Abdominal: Soft. He exhibits no distension. There is no tenderness.  Musculoskeletal: Normal range of motion.       Mild pain with range of motion of left wrist.  Normal left radial pulse.  Normal strength in left grip.  Normal sensation left hand.  No obvious deformity.  Tenderness along the ulnar or radial head.  Neurological: He is alert and oriented to person, place, and time.  Skin: Skin is warm and dry.  Psychiatric: He has a normal mood and affect. Judgment normal.    ED Course  Procedures (including critical care time)  Labs Reviewed - No data to display Dg Wrist Complete Left  01/30/2012  *RADIOLOGY REPORT*  Clinical Data: Left wrist pain  LEFT WRIST - COMPLETE 3+ VIEW  Comparison:  Left wrist radiographs - 09/12/2004  Findings: No fracture or dislocation.  No definite displacement of the pronator quadratus fat pad.  There is minimal degenerative change of the radiocarpal articulation with several geodes noted within the lunate.  No definite evidence of chondrocalcinosis. No radiopaque foreign body.  IMPRESSION: Minimal degenerative change of the wrist without fracture or dislocation.   Original Report Authenticated By: Tacey Ruiz, MD    I personally reviewed the imaging tests through PACS system I reviewed available ER/hospitalization records through the EMR   1. Left wrist pain       MDM  No fracture.  Likely sprain.  Wrist splint.  Orthopedic hand surgery followup.        Lyanne Co, MD 01/30/12 1005

## 2012-01-30 NOTE — Progress Notes (Signed)
Orthopedic Tech Progress Note Patient Details:  Nicholas [name] [birth year] [id number] Velcro wrist splint applied to Left UE. Tolerated well.  Ortho Devices Type of Ortho Device: Velcro wrist splint Ortho Device/Splint Location: Left  Ortho Device/Splint Interventions: Application   Asia R Thompson 01/30/2012, 10:15 AM

## 2012-02-27 ENCOUNTER — Other Ambulatory Visit: Payer: Self-pay

## 2012-11-17 ENCOUNTER — Other Ambulatory Visit: Payer: Self-pay

## 2013-12-21 ENCOUNTER — Encounter (HOSPITAL_COMMUNITY): Payer: Self-pay | Admitting: Internal Medicine

## 2014-10-09 ENCOUNTER — Emergency Department (INDEPENDENT_AMBULATORY_CARE_PROVIDER_SITE_OTHER)
Admission: EM | Admit: 2014-10-09 | Discharge: 2014-10-09 | Disposition: A | Discharge status: Home / Self Care | Payer: BLUE CROSS/BLUE SHIELD | Source: Home / Self Care | Attending: Emergency Medicine | Admitting: Emergency Medicine

## 2014-10-09 ENCOUNTER — Encounter (HOSPITAL_COMMUNITY): Payer: Self-pay | Admitting: Emergency Medicine

## 2014-10-09 DIAGNOSIS — J40 Bronchitis, not specified as acute or chronic: Secondary | ICD-10-CM | POA: Diagnosis not present

## 2014-10-09 DIAGNOSIS — Z720 Tobacco use: Secondary | ICD-10-CM | POA: Diagnosis not present

## 2014-10-09 DIAGNOSIS — J9801 Acute bronchospasm: Secondary | ICD-10-CM | POA: Diagnosis not present

## 2014-10-09 DIAGNOSIS — J069 Acute upper respiratory infection, unspecified: Secondary | ICD-10-CM

## 2014-10-09 MED ORDER — PREDNISONE 20 MG PO TABS: ORAL_TABLET | ORAL | Status: DC

## 2014-10-09 MED ORDER — ALBUTEROL SULFATE HFA 108 (90 BASE) MCG/ACT IN AERS: 2.0000 | INHALATION_SPRAY | RESPIRATORY_TRACT | Status: AC | PRN

## 2014-10-09 MED ORDER — IPRATROPIUM-ALBUTEROL 0.5-2.5 (3) MG/3ML IN SOLN
3.0000 mL | Freq: Once | RESPIRATORY_TRACT | Status: AC
  Administered 2014-10-09: 3 mL via RESPIRATORY_TRACT

## 2014-10-09 MED ORDER — IPRATROPIUM BROMIDE 0.06 % NA SOLN: 2.0000 | Freq: Four times a day (QID) | NASAL | Status: DC

## 2014-10-09 MED ORDER — ALBUTEROL SULFATE (2.5 MG/3ML) 0.083% IN NEBU
INHALATION_SOLUTION | RESPIRATORY_TRACT | Status: AC
Start: 2014-10-09 — End: 2014-10-09
  Filled 2014-10-09: qty 3

## 2014-10-09 MED ORDER — CEFDINIR 300 MG PO CAPS: 300.0000 mg | ORAL_CAPSULE | Freq: Two times a day (BID) | ORAL | Status: DC

## 2014-10-09 MED ORDER — IPRATROPIUM-ALBUTEROL 0.5-2.5 (3) MG/3ML IN SOLN
RESPIRATORY_TRACT | Status: AC
Start: 2014-10-09 — End: 2014-10-09
  Filled 2014-10-09: qty 3

## 2014-10-09 MED ORDER — ALBUTEROL SULFATE (2.5 MG/3ML) 0.083% IN NEBU
2.5000 mg | INHALATION_SOLUTION | Freq: Once | RESPIRATORY_TRACT | Status: AC
  Administered 2014-10-09: 2.5 mg via RESPIRATORY_TRACT

## 2014-10-09 NOTE — ED Provider Notes (Signed)
CSN: [id number]     Arrival date & time 10/09/14  1819 History   First MD Initiated Contact with Patient 10/09/14 1933     Chief Complaint  Patient presents with  . URI   (Consider location/radiation/quality/duration/timing/severity/associated sxs/prior Treatment) HPI Comments: 59 year old male who smokes half a pack per day for decades is complaining of a sore throat this started 5 days ago. Following this was upper respiratory congestion, nasal congestion, runny nose, feeling weak, tired and fatigued. He is complaining of left forehead pain which radiates to the right forehead, frequent cough, chest congestion, PND, sniffles and nasal congestion. Denies known fever. His only medication is Mucinex DM.   Past Medical History  Diagnosis Date  . Personal history of tobacco use, presenting hazards to health   . Exposure to noise   . Spondylosis of unspecified site without mention of myelopathy   . Other specified cardiac dysrhythmias(427.89)     bradycardia  . Cardiac pacemaker in situ   . Unspecified mastoiditis   . Unspecified otitis media   . PTSD (post-traumatic stress disorder)    Past Surgical History  Procedure Laterality Date  . Permanent pacemaker    . Inner ear surgery    . Generator removal N/A 08/12/2011    Procedure: GENERATOR REMOVAL;  Surgeon: Marinus Maw, MD;  Location: [facility] CATH LAB;  Service: Cardiovascular;  Laterality: N/A;   No family history on file. Social History  Substance Use Topics  . Smoking status: Smoker, Current Status Unknown  . Smokeless tobacco: None  . Alcohol Use: Yes    Review of Systems  Constitutional: Positive for activity change and fatigue. Negative for fever and diaphoresis.  HENT: Positive for congestion, postnasal drip, rhinorrhea, sinus pressure and sore throat. Negative for ear pain, facial swelling and trouble swallowing.   Eyes: Negative for pain, discharge and redness.  Respiratory: Positive for cough. Negative for chest  tightness and shortness of breath.   Cardiovascular: Negative for chest pain.  Gastrointestinal: Positive for abdominal pain.       Abdominal pain associated with coughing only.  Genitourinary: Negative.   Musculoskeletal: Negative.  Negative for neck pain and neck stiffness.  Skin: Negative for rash.  Neurological: Negative.     Allergies  Review of patient's allergies indicates no known allergies.  Home Medications   Prior to Admission medications   Medication Sig Start Date End Date Taking? Authorizing Provider  albuterol (PROVENTIL HFA;VENTOLIN HFA) 108 (90 BASE) MCG/ACT inhaler Inhale 2 puffs into the lungs every 4 (four) hours as needed for wheezing or shortness of breath. 10/09/14   Hayden Rasmussen, NP  cefdinir (OMNICEF) 300 MG capsule Take 1 capsule (300 mg total) by mouth 2 (two) times daily. 10/09/14   Hayden Rasmussen, NP  GuaiFENesin (MUCINEX PO) Take 2 tablets by mouth daily as needed. For congestion    Historical Provider, MD  ibuprofen (ADVIL,MOTRIN) 600 MG tablet Take 1 tablet (600 mg total) by mouth every 8 (eight) hours as needed for pain. 01/30/12   Azalia Bilis, MD  ipratropium (ATROVENT) 0.06 % nasal spray Place 2 sprays into both nostrils 4 (four) times daily. 10/09/14   Hayden Rasmussen, NP  predniSONE (DELTASONE) 20 MG tablet Take 3 tabs po on first day, 2 tabs second day, 2 tabs third day, 1 tab fourth day, 1 tab 5th day. Take with food. 10/09/14   Hayden Rasmussen, NP   Meds Ordered and Administered this Visit   Medications  ipratropium-albuterol (DUONEB) 0.5-2.5 (3) MG/3ML nebulizer solution  3 mL (3 mLs Nebulization Given 10/09/14 2008)  albuterol (PROVENTIL) (2.5 MG/3ML) 0.083% nebulizer solution 2.5 mg (2.5 mg Nebulization Given 10/09/14 2008)    BP 114/68 mmHg  Pulse 91  Temp(Src) 98.2 F (36.8 C) (Oral)  Resp 18  SpO2 96% No data found.   Physical Exam  Constitutional: He is oriented to person, place, and time. He appears well-developed and well-nourished. No distress.   HENT:  Mouth/Throat: No oropharyngeal exudate.  Right TM occluded with wax. Left TM normal Oropharynx with clear PND, minor injection. No exudates or swelling.  Eyes: Conjunctivae and EOM are normal.  Neck: Normal range of motion. Neck supple.  Cardiovascular: Normal rate, regular rhythm and normal heart sounds.   Pulmonary/Chest: Effort normal. No respiratory distress.  Bilateral expiratory coarseness and wheeze.  Musculoskeletal: Normal range of motion. He exhibits no edema.  Lymphadenopathy:    He has no cervical adenopathy.  Neurological: He is alert and oriented to person, place, and time. He exhibits normal muscle tone.  Skin: Skin is warm and dry. No rash noted.  Psychiatric: He has a normal mood and affect.  Nursing note and vitals reviewed.   ED Course  Procedures (including critical care time)  Labs Review Labs Reviewed - No data to display  Imaging Review No results found.   Visual Acuity Review  Right Eye Distance:   Left Eye Distance:   Bilateral Distance:    Right Eye Near:   Left Eye Near:    Bilateral Near:         MDM   1. URI (upper respiratory infection)   2. Bronchitis   3. Bronchospasm   4. Tobacco abuse disorder    Zyrtec or Allegra once a day for drainage and sniffles Use the Atrovent nasal spray to help with runny nose and sniffles. Flonase nasal spray as an option to help with possible allergy symptoms. Albuterol HFA as directed for breathing cough and wheeze Stop smoking Drink any fluids. Tylenol every 4 hours as needed Omnicef 300 twice a day Sterapred taper dose. DuoNeb. 5/2.5 mg. Post DuoNeb patient states he has modest improvement. He continues to have some coarseness on expiration.   Hayden Rasmussen, NP 10/09/14 2036

## 2014-10-09 NOTE — Discharge Instructions (Signed)
Upper Respiratory Infection, Adult 1 An upper respiratory infection (URI) is also sometimes known as the common cold. The upper respiratory tract includes the nose, sinuses, throat, trachea, and bronchi. Bronchi are the airways leading to the lungs. Most people improve within 1 week, but symptoms can last up to 2 weeks. A residual cough may last even longer.  CAUSES Many different viruses can infect the tissues lining the upper respiratory tract. The tissues become irritated and inflamed and often become very moist. Mucus production is also common. A cold is contagious. You can easily spread the virus to others by oral contact. This includes kissing, sharing a glass, coughing, or sneezing. Touching your mouth or nose and then touching a surface, which is then touched by another person, can also spread the virus. SYMPTOMS  Symptoms typically develop 1 to 3 days after you come in contact with a cold virus. Symptoms vary from person to person. They may include:  Runny nose.  Sneezing.  Nasal congestion.  Sinus irritation.  Sore throat.  Loss of voice (laryngitis).  Cough.  Fatigue.  Muscle aches.  Loss of appetite.  Headache.  Low-grade fever. DIAGNOSIS  You might diagnose your own cold based on familiar symptoms, since most people get a cold 2 to 3 times a year. Your caregiver can confirm this based on your exam. Most importantly, your caregiver can check that your symptoms are not due to another disease such as strep throat, sinusitis, pneumonia, asthma, or epiglottitis. Blood tests, throat tests, and X-rays are not necessary to diagnose a common cold, but they may sometimes be helpful in excluding other more serious diseases. Your caregiver will decide if any further tests are required. RISKS AND COMPLICATIONS  You may be at risk for a more severe case of the common cold if you smoke cigarettes, have chronic heart disease (such as heart failure) or lung disease (such as asthma), or  if you have a weakened immune system. The very young and very old are also at risk for more serious infections. Bacterial sinusitis, middle ear infections, and bacterial pneumonia can complicate the common cold. The common cold can worsen asthma and chronic obstructive pulmonary disease (COPD). Sometimes, these complications can require emergency medical care and may be life-threatening. PREVENTION  The best way to protect against getting a cold is to practice good hygiene. Avoid oral or hand contact with people with cold symptoms. Wash your hands often if contact occurs. There is no clear evidence that vitamin C, vitamin E, echinacea, or exercise reduces the chance of developing a cold. However, it is always recommended to get plenty of rest and practice good nutrition. TREATMENT  Treatment is directed at relieving symptoms. There is no cure. Antibiotics are not effective, because the infection is caused by a virus, not by bacteria. Treatment may include:  Increased fluid intake. Sports drinks offer valuable electrolytes, sugars, and fluids.  Breathing heated mist or steam (vaporizer or shower).  Eating chicken soup or other clear broths, and maintaining good nutrition.  Getting plenty of rest.  Using gargles or lozenges for comfort.  Controlling fevers with ibuprofen or acetaminophen as directed by your caregiver.  Increasing usage of your inhaler if you have asthma. Zinc gel and zinc lozenges, taken in the first 24 hours of the common cold, can shorten the duration and lessen the severity of symptoms. Pain medicines may help with fever, muscle aches, and throat pain. A variety of non-prescription medicines are available to treat congestion and runny nose. Your  caregiver can make recommendations and may suggest nasal or lung inhalers for other symptoms.  HOME CARE INSTRUCTIONS   Only take over-the-counter or prescription medicines for pain, discomfort, or fever as directed by your  caregiver.  Use a warm mist humidifier or inhale steam from a shower to increase air moisture. This may keep secretions moist and make it easier to breathe.  Drink enough water and fluids to keep your urine clear or pale yellow.  Rest as needed.  Return to work when your temperature has returned to normal or as your caregiver advises. You may need to stay home longer to avoid infecting others. You can also use a face mask and careful hand washing to prevent spread of the virus. SEEK MEDICAL CARE IF:   After the first few days, you feel you are getting worse rather than better.  You need your caregiver's advice about medicines to control symptoms.  You develop chills, worsening shortness of breath, or brown or red sputum. These may be signs of pneumonia.  You develop yellow or brown nasal discharge or pain in the face, especially when you bend forward. These may be signs of sinusitis.  You develop a fever, swollen neck glands, pain with swallowing, or white areas in the back of your throat. These may be signs of strep throat. SEEK IMMEDIATE MEDICAL CARE IF:   You have a fever.  You develop severe or persistent headache, ear pain, sinus pain, or chest pain.  You develop wheezing, a prolonged cough, cough up blood, or have a change in your usual mucus (if you have chronic lung disease).  You develop sore muscles or a stiff neck. Document Released: 06/24/2000 Document Revised: 03/23/2011 Document Reviewed: 04/05/2013 [facility] Patient Information 2015 [place], [place]. This information is not intended to replace advice given to you by your health care provider. Make sure you discuss any questions you have with your health care provider.  Bronchospasm A bronchospasm is a spasm or tightening of the airways going into the lungs. During a bronchospasm breathing becomes more difficult because the airways get smaller. When this happens there can be coughing, a whistling sound when breathing  (wheezing), and difficulty breathing. Bronchospasm is often associated with asthma, but not all patients who experience a bronchospasm have asthma. CAUSES  A bronchospasm is caused by inflammation or irritation of the airways. The inflammation or irritation may be triggered by:   Allergies (such as to animals, pollen, food, or mold). Allergens that cause bronchospasm may cause wheezing immediately after exposure or many hours later.   Infection. Viral infections are believed to be the most common cause of bronchospasm.   Exercise.   Irritants (such as pollution, cigarette smoke, strong odors, aerosol sprays, and paint fumes).   Weather changes. Winds increase molds and pollens in the air. Rain refreshes the air by washing irritants out. Cold air may cause inflammation.   Stress and emotional upset.  SIGNS AND SYMPTOMS   Wheezing.   Excessive nighttime coughing.   Frequent or severe coughing with a simple cold.   Chest tightness.   Shortness of breath.  DIAGNOSIS  Bronchospasm is usually diagnosed through a history and physical exam. Tests, such as chest X-rays, are sometimes done to look for other conditions. TREATMENT   Inhaled medicines can be given to open up your airways and help you breathe. The medicines can be given using either an inhaler or a nebulizer machine.  Corticosteroid medicines may be given for severe bronchospasm, usually when it is  associated with asthma. HOME CARE INSTRUCTIONS   Always have a plan prepared for seeking medical care. Know when to call your health care provider and local emergency services (911 in the [place] Know where you can access local emergency care.  Only take medicines as directed by your health care provider.  If you were prescribed an inhaler or nebulizer machine, ask your health care provider to explain how to use it correctly. Always use a spacer with your inhaler if you were given one.  It is necessary to remain calm  during an attack. Try to relax and breathe more slowly.  Control your home environment in the following ways:   Change your heating and air conditioning filter at least once a month.   Limit your use of fireplaces and wood stoves.  Do not smoke and do not allow smoking in your home.   Avoid exposure to perfumes and fragrances.   Get rid of pests (such as roaches and mice) and their droppings.   Throw away plants if you see mold on them.   Keep your house clean and dust free.   Replace carpet with wood, tile, or vinyl flooring. Carpet can trap dander and dust.   Use allergy-proof pillows, mattress covers, and box spring covers.   Wash bed sheets and blankets every week in hot water and dry them in a dryer.   Use blankets that are made of polyester or cotton.   Wash hands frequently. SEEK MEDICAL CARE IF:   You have muscle aches.   You have chest pain.   The sputum changes from clear or white to yellow, green, gray, or bloody.   The sputum you cough up gets thicker.   There are problems that may be related to the medicine you are given, such as a rash, itching, swelling, or trouble breathing.  SEEK IMMEDIATE MEDICAL CARE IF:   You have worsening wheezing and coughing even after taking your prescribed medicines.   You have increased difficulty breathing.   You develop severe chest pain. MAKE SURE YOU:   Understand these instructions.  Will watch your condition.  Will get help right away if you are not doing well or get worse. Document Released: 01/01/2003 Document Revised: 01/03/2013 Document Reviewed: 06/20/2012 [facility] Patient Information 2015 [place], [place]. This information is not intended to replace advice given to you by your health care provider. Make sure you discuss any questions you have with your health care provider.  How to Use an Inhaler Using your inhaler correctly is very important. Good technique will make sure that the  medicine reaches your lungs.  HOW TO USE AN INHALER:  Take the cap off the inhaler.  If this is the first time using your inhaler, you need to prime it. Shake the inhaler for 5 seconds. Release four puffs into the air, away from your face. Ask your doctor for help if you have questions.  Shake the inhaler for 5 seconds.  Turn the inhaler so the bottle is above the mouthpiece.  Put your pointer finger on top of the bottle. Your thumb holds the bottom of the inhaler.  Open your mouth.  Either hold the inhaler away from your mouth (the width of 2 fingers) or place your lips tightly around the mouthpiece. Ask your doctor which way to use your inhaler.  Breathe out as much air as possible.  Breathe in and push down on the bottle 1 time to release the medicine. You will feel the medicine go  in your mouth and throat.  Continue to take a deep breath in very slowly. Try to fill your lungs.  After you have breathed in completely, hold your breath for 10 seconds. This will help the medicine to settle in your lungs. If you cannot hold your breath for 10 seconds, hold it for as long as you can before you breathe out.  Breathe out slowly, through pursed lips. Whistling is an example of pursed lips.  If your doctor has told you to take more than 1 puff, wait at least 15-30 seconds between puffs. This will help you get the best results from your medicine. Do not use the inhaler more than your doctor tells you to.  Put the cap back on the inhaler.  Follow the directions from your doctor or from the inhaler package about cleaning the inhaler. If you use more than one inhaler, ask your doctor which inhalers to use and what order to use them in. Ask your doctor to help you figure out when you will need to refill your inhaler.  If you use a steroid inhaler, always rinse your mouth with water after your last puff, gargle and spit out the water. Do not swallow the water. GET HELP IF:  The inhaler  medicine only partially helps to stop wheezing or shortness of breath.  You are having trouble using your inhaler.  You have some increase in thick spit (phlegm). GET HELP RIGHT AWAY IF:  The inhaler medicine does not help your wheezing or shortness of breath or you have tightness in your chest.  You have dizziness, headaches, or fast heart rate.  You have chills, fever, or night sweats.  You have a large increase of thick spit, or your thick spit is bloody. MAKE SURE YOU:   Understand these instructions.  Will watch your condition.  Will get help right away if you are not doing well or get worse. Document Released: 10/08/2007 Document Revised: 10/19/2012 Document Reviewed: 07/28/2012 [facility] Patient Information 2015 [place], [place]. This information is not intended to replace advice given to you by your health care provider. Make sure you discuss any questions you have with your health care provider.  Smoking Cessation Quitting smoking is important to your health and has many advantages. However, it is not always easy to quit since nicotine is a very addictive drug. Oftentimes, people try 3 times or more before being able to quit. This document explains the best ways for you to prepare to quit smoking. Quitting takes hard work and a lot of effort, but you can do it. ADVANTAGES OF QUITTING SMOKING  You will live longer, feel better, and live better.  Your body will feel the impact of quitting smoking almost immediately.  Within 20 minutes, blood pressure decreases. Your pulse returns to its normal level.  After 8 hours, carbon monoxide levels in the blood return to normal. Your oxygen level increases.  After 24 hours, the chance of having a heart attack starts to decrease. Your breath, hair, and body stop smelling like smoke.  After 48 hours, damaged nerve endings begin to recover. Your sense of taste and smell improve.  After 72 hours, the body is virtually free of nicotine.  Your bronchial tubes relax and breathing becomes easier.  After 2 to 12 weeks, lungs can hold more air. Exercise becomes easier and circulation improves.  The risk of having a heart attack, stroke, cancer, or lung disease is greatly reduced.  After 1 year, the risk of coronary  heart disease is cut in half.  After 5 years, the risk of stroke falls to the same as a nonsmoker.  After 10 years, the risk of lung cancer is cut in half and the risk of other cancers decreases significantly.  After 15 years, the risk of coronary heart disease drops, usually to the level of a nonsmoker.  If you are pregnant, quitting smoking will improve your chances of having a healthy baby.  The people you live with, especially any children, will be healthier.  You will have extra money to spend on things other than cigarettes. QUESTIONS TO THINK ABOUT BEFORE ATTEMPTING TO QUIT You may want to talk about your answers with your health care provider.  Why do you want to quit?  If you tried to quit in the past, what helped and what did not?  What will be the most difficult situations for you after you quit? How will you plan to handle them?  Who can help you through the tough times? Your family? Friends? A health care provider?  What pleasures do you get from smoking? What ways can you still get pleasure if you quit? Here are some questions to ask your health care provider:  How can you help me to be successful at quitting?  What medicine do you think would be best for me and how should I take it?  What should I do if I need more help?  What is smoking withdrawal like? How can I get information on withdrawal? GET READY  Set a quit date.  Change your environment by getting rid of all cigarettes, ashtrays, matches, and lighters in your home, car, or work. Do not let people smoke in your home.  Review your past attempts to quit. Think about what worked and what did not. GET SUPPORT AND  ENCOURAGEMENT You have a better chance of being successful if you have help. You can get support in many ways.  Tell your family, friends, and coworkers that you are going to quit and need their support. Ask them not to smoke around you.  Get individual, group, or telephone counseling and support. Programs are available at [employer] and health centers. Call your local health department for information about programs in your area.  Spiritual beliefs and practices may help some smokers quit.  Download a "quit meter" on your computer to keep track of quit statistics, such as how long you have gone without smoking, cigarettes not smoked, and money saved.  Get a self-help book about quitting smoking and staying off tobacco. LEARN NEW SKILLS AND BEHAVIORS  Distract yourself from urges to smoke. Talk to someone, go for a walk, or occupy your time with a task.  Change your normal routine. Take a different route to work. Drink tea instead of coffee. Eat breakfast in a different place.  Reduce your stress. Take a hot bath, exercise, or read a book.  Plan something enjoyable to do every day. Reward yourself for not smoking.  Explore interactive web-based programs that specialize in helping you quit. GET MEDICINE AND USE IT CORRECTLY Medicines can help you stop smoking and decrease the urge to smoke. Combining medicine with the above behavioral methods and support can greatly increase your chances of successfully quitting smoking.  Nicotine replacement therapy helps deliver nicotine to your body without the negative effects and risks of smoking. Nicotine replacement therapy includes nicotine gum, lozenges, inhalers, nasal sprays, and skin patches. Some may be available over-the-counter and others require a prescription.  Antidepressant medicine helps people abstain from smoking, but how this works is unknown. This medicine is available by prescription.  Nicotinic receptor partial agonist  medicine simulates the effect of nicotine in your brain. This medicine is available by prescription. Ask your health care provider for advice about which medicines to use and how to use them based on your health history. Your health care provider will tell you what side effects to look out for if you choose to be on a medicine or therapy. Carefully read the information on the package. Do not use any other product containing nicotine while using a nicotine replacement product.  RELAPSE OR DIFFICULT SITUATIONS Most relapses occur within the first 3 months after quitting. Do not be discouraged if you start smoking again. Remember, most people try several times before finally quitting. You may have symptoms of withdrawal because your body is used to nicotine. You may crave cigarettes, be irritable, feel very hungry, cough often, get headaches, or have difficulty concentrating. The withdrawal symptoms are only temporary. They are strongest when you first quit, but they will go away within 10-14 days. To reduce the chances of relapse, try to:  Avoid drinking alcohol. Drinking lowers your chances of successfully quitting.  Reduce the amount of caffeine you consume. Once you quit smoking, the amount of caffeine in your body increases and can give you symptoms, such as a rapid heartbeat, sweating, and anxiety.  Avoid smokers because they can make you want to smoke.  Do not let weight gain distract you. Many smokers will gain weight when they quit, usually less than 10 pounds. Eat a healthy diet and stay active. You can always lose the weight gained after you quit.  Find ways to improve your mood other than smoking. FOR MORE INFORMATION  www.smokefree.gov  Document Released: 12/23/2000 Document Revised: 05/15/2013 Document Reviewed: 04/09/2011 [facility] Patient Information 2015 [place], [place]. This information is not intended to replace advice given to you by your health care provider. Make sure you discuss  any questions you have with your health care provider.

## 2014-10-09 NOTE — ED Notes (Signed)
C/o cold sx onset 6 days Sx include facial pressure, chest congestion, yellowish prod cough Denies fevers, chills, SOB, dyspnea Smokes 0.5 PPD Alert and oriented x4... No acute distress.

## 2016-06-12 ENCOUNTER — Ambulatory Visit (HOSPITAL_COMMUNITY)
Admission: EM | Admit: 2016-06-12 | Discharge: 2016-06-12 | Disposition: A | Discharge status: Home / Self Care | Payer: BLUE CROSS/BLUE SHIELD | Attending: Emergency Medicine | Admitting: Emergency Medicine

## 2016-06-12 ENCOUNTER — Encounter (HOSPITAL_COMMUNITY): Payer: Self-pay | Admitting: Family Medicine

## 2016-06-12 DIAGNOSIS — H60501 Unspecified acute noninfective otitis externa, right ear: Secondary | ICD-10-CM | POA: Diagnosis not present

## 2016-06-12 DIAGNOSIS — H9201 Otalgia, right ear: Secondary | ICD-10-CM

## 2016-06-12 MED ORDER — NEOMYCIN-POLYMYXIN-HC 3.5-10000-1 OT SUSP: 4.0000 [drp] | Freq: Four times a day (QID) | OTIC | 0 refills | Status: AC

## 2016-06-12 MED ORDER — CEFDINIR 300 MG PO CAPS: 300.0000 mg | ORAL_CAPSULE | Freq: Two times a day (BID) | ORAL | 0 refills | Status: AC

## 2016-06-12 NOTE — ED Triage Notes (Signed)
Pt here for right ear pain x 2 days  °

## 2016-06-12 NOTE — Discharge Instructions (Signed)
DO NOT STICK ANYTHING IN YOUR EARS(QTIPS,BOBBY PIN,TOOTHPICKS,PENS,PENCILS OR YOUR FINGERS) AS YOU CAN DAMAGE THE EAR,THE CANAL OR OTHER STRUCTURES IN EAR. PLEASE FOLLOW UP WITH YOUR ENT SPECIALIST-DR KRAUS-CALL TODAY FOR FOLLOW UP APPT. DO NOT GET WATER IN EARS. USE MEDS AS DIRECTED. RETURN AS NEEDED.

## 2016-06-12 NOTE — ED Provider Notes (Signed)
CSN: [id number]     Arrival date & time 06/12/16  1005 History   None    Chief Complaint  Patient presents with  . Otalgia   (Consider location/radiation/quality/duration/timing/severity/associated sxs/prior Treatment) The history is provided by the patient. No language interpreter was used.  Otalgia  Location:  Right Behind ear: old scarring from 2008 surgery, no new issues. Associated symptoms: no ear discharge and no fever     Past Medical History:  Diagnosis Date  . Cardiac pacemaker in situ   . Exposure to noise   . Other specified cardiac dysrhythmias(427.89)    bradycardia  . Personal history of tobacco use, presenting hazards to health   . PTSD (post-traumatic stress disorder)   . Spondylosis of unspecified site without mention of myelopathy   . Unspecified mastoiditis   . Unspecified otitis media    Past Surgical History:  Procedure Laterality Date  . GENERATOR REMOVAL N/A 08/12/2011   Procedure: GENERATOR REMOVAL;  Surgeon: Marinus MawGregg W Taylor, MD;  Location: [facility] C[facility];  Service: Cardiovascular;  Laterality: N/A;  . INNER EAR SURGERY    . permanent pacemaker     History reviewed. No pertinent family history. Social History  Substance Use Topics  . Smoking status: Smoker, Current Status Unknown  . Smokeless tobacco: Not on file  . Alcohol use Yes    Review of Systems  Constitutional: Negative for fever.  HENT: Positive for ear pain. Negative for ear discharge and facial swelling.   All other systems reviewed and are negative.   Allergies  Patient has no known allergies.  Home Medications   Prior to Admission medications   Medication Sig Start Date End Date Taking? Authorizing Provider  albuterol (PROVENTIL HFA;VENTOLIN HFA) 108 (90 BASE) MCG/ACT inhaler Inhale 2 puffs into the lungs every 4 (four) hours as needed for wheezing or shortness of breath. 10/09/14   Hayden RasmussenMabe, David, NP  cefdinir (OMNICEF) 300 MG capsule Take 1 capsule (300 mg total) by mouth 2 (two)  times daily. 06/12/16 06/22/16  Rilyn Scroggs, Para MarchJeanette, NP  GuaiFENesin (MUCINEX PO) Take 2 tablets by mouth daily as needed. For congestion    [provider]  ibuprofen (ADVIL,MOTRIN) 600 MG tablet Take 1 tablet (600 mg total) by mouth every 8 (eight) hours as needed for pain. 01/30/12   Azalia Bilisampos, Kevin, MD  ipratropium (ATROVENT) 0.06 % nasal spray Place 2 sprays into both nostrils 4 (four) times daily. 10/09/14   Hayden RasmussenMabe, David, NP  neomycin-polymyxin-hydrocortisone (CORTISPORIN) 3.5-10000-1 otic suspension Place 4 drops into the right ear 4 (four) times daily. 06/12/16 06/19/16  Shiva Karis, Para MarchJeanette, NP  predniSONE (DELTASONE) 20 MG tablet Take 3 tabs po on first day, 2 tabs second day, 2 tabs third day, 1 tab fourth day, 1 tab 5th day. Take with food. 10/09/14   Hayden RasmussenMabe, David, NP   Meds Ordered and Administered this Visit  Medications - No data to display  BP 122/81   Pulse 66   Temp 98.2 F (36.8 C)   Resp 18   SpO2 100%  No data found.   Physical Exam  Constitutional: He is oriented to person, place, and time. He appears well-developed and well-nourished. He is active and cooperative. He does not have a sickly appearance. He does not appear ill. No distress.  HENT:  Head: Normocephalic.  Right Ear: There is swelling and tenderness. No mastoid tenderness. No hemotympanum.  Left Ear: Tympanic membrane normal. No mastoid tenderness. No hemotympanum.  Nose: No mucosal edema, nasal deformity, septal deviation or  nasal septal hematoma. No epistaxis. Right sinus exhibits no maxillary sinus tenderness. Left sinus exhibits no maxillary sinus tenderness.  Mouth/Throat: Uvula is midline, oropharynx is clear and moist and mucous membranes are normal. No trismus in the jaw. No uvula swelling. No oropharyngeal exudate.  Eyes: Conjunctivae and EOM are normal. Pupils are equal, round, and reactive to light. Right eye exhibits no discharge. Left eye exhibits no discharge. Right conjunctiva is not injected. Right  conjunctiva has no hemorrhage. Left conjunctiva is not injected. Left conjunctiva has no hemorrhage. Right pupil is round and reactive. Left pupil is round and reactive. Pupils are equal.  Left lower lid+edema, +TTP orbital rim, no entrapment, no hyphema. Vision 20/40 bilaterally without glasses  Neck: Trachea normal and full passive range of motion without pain. No tracheal deviation present.  Cardiovascular: Normal rate and regular rhythm.   Pulmonary/Chest: Effort normal and breath sounds normal.  Musculoskeletal: Normal range of motion.  Neurological: He is alert and oriented to person, place, and time. He has normal strength. No cranial nerve deficit or sensory deficit. GCS eye subscore is 4. GCS verbal subscore is 5. GCS motor subscore is 6.  Skin: Skin is warm and dry.  Psychiatric: He has a normal mood and affect. His speech is normal.  Nursing note and vitals reviewed.   Urgent Care Course     Procedures (including critical care time)  Labs Review Labs Reviewed - No data to display  Imaging Review No results found.        MDM   1. Acute otitis externa of right ear, unspecified type   2. Right ear pain     DO NOT STICK ANYTHING IN YOUR EARS(QTIPS,BOBBY PIN,TOOTHPICKS,PENS,PENCILS OR YOUR FINGERS) AS YOU CAN DAMAGE THE EAR,THE CANAL OR OTHER STRUCTURES IN EAR. PLEASE FOLLOW UP WITH YOUR ENT SPECIALIST-DR KRAUS-CALL TODAY FOR FOLLOW UP APPT. DO NOT GET WATER IN EARS. USE MEDS AS DIRECTED. RETURN AS NEEDED. PT VERBALIZED UNDERSTANDING TO THIS PROVIDER.    Clancy Gourd, NP 06/12/16 [phone]

## 2017-10-29 ENCOUNTER — Ambulatory Visit (HOSPITAL_COMMUNITY)
Admission: EM | Admit: 2017-10-29 | Discharge: 2017-10-29 | Disposition: A | Discharge status: Home / Self Care | Payer: BLUE CROSS/BLUE SHIELD | Attending: Family Medicine | Admitting: Family Medicine

## 2017-10-29 ENCOUNTER — Encounter (HOSPITAL_COMMUNITY): Payer: Self-pay | Admitting: Emergency Medicine

## 2017-10-29 ENCOUNTER — Other Ambulatory Visit: Payer: Self-pay

## 2017-10-29 DIAGNOSIS — J441 Chronic obstructive pulmonary disease with (acute) exacerbation: Secondary | ICD-10-CM | POA: Diagnosis not present

## 2017-10-29 MED ORDER — DOXYCYCLINE HYCLATE 100 MG PO CAPS: 100.0000 mg | ORAL_CAPSULE | Freq: Two times a day (BID) | ORAL | 0 refills | Status: AC

## 2017-10-29 MED ORDER — PREDNISONE 20 MG PO TABS: 20.0000 mg | ORAL_TABLET | Freq: Two times a day (BID) | ORAL | 0 refills | Status: DC

## 2017-10-29 NOTE — Discharge Instructions (Signed)
Take the antibiotic twice a day Take the prednisone as directed Drink plenty of fluids May continue Mucinex DM Return if not improving over the next several days Congratulations on your effort to quit smoking

## 2017-10-29 NOTE — ED Provider Notes (Signed)
[facility]    CSN: [id number] Arrival date & time: 10/29/17  0915     History   Chief Complaint Chief Complaint  Patient presents with  . URI    HPI Nicholas [name] is a 62 y.o. male.   HPI  Patient is a longtime smoker.  He has underlying COPD.  He is developing a respiratory infection with nasal congestion, postnasal drip, yellow sinus drainage.  Mild sore throat.  No ear pressure or pain.  No headache.  He is coughing and has some yellow sputum.  No shortness of breath or wheezing.  He states he has been using over-the-counter Mucinex.  Also took ibuprofen.  He states he usually needs antibiotics when he gets his infections. We did discuss his smoking.  He states he is trying to quit.  He is congratulated on his effort.  Past Medical History:  Diagnosis Date  . Cardiac pacemaker in situ   . Exposure to noise   . Other specified cardiac dysrhythmias(427.89)    bradycardia  . Personal history of tobacco use, presenting hazards to health   . PTSD (post-traumatic stress disorder)   . Spondylosis of unspecified site without mention of myelopathy   . Unspecified mastoiditis   . Unspecified otitis media     Patient Active Problem List   Diagnosis Date Noted  . HYPERTROPHY PROSTATE W/UR OBST & OTH LUTS 08/13/2008  . COUGH 08/13/2008  . INSOMNIA 07/09/2008  . MASTOIDITIS 06/05/2008  . BRADYCARDIA 06/05/2008  . SPONDYLOSIS UNSPEC SITE W/O MENTION MYELOPATHY 06/05/2008  . TOBACCO ABUSE, HX OF 06/05/2008  . CARDIAC PACEMAKER IN SITU 06/05/2008    Past Surgical History:  Procedure Laterality Date  . GENERATOR REMOVAL N/A 08/12/2011   Procedure: GENERATOR REMOVAL;  Surgeon: Marinus Maw, MD;  Location: [facility] CATH LAB;  Service: Cardiovascular;  Laterality: N/A;  . INNER EAR SURGERY    . permanent pacemaker         Home Medications    Prior to Admission medications   Medication Sig Start Date End Date Taking? Authorizing Provider  ibuprofen (ADVIL,MOTRIN)  600 MG tablet Take 1 tablet (600 mg total) by mouth every 8 (eight) hours as needed for pain. 01/30/12  Yes Azalia Bilis, MD  albuterol (PROVENTIL HFA;VENTOLIN HFA) 108 (90 BASE) MCG/ACT inhaler Inhale 2 puffs into the lungs every 4 (four) hours as needed for wheezing or shortness of breath. 10/09/14   Hayden Rasmussen, NP  doxycycline (VIBRAMYCIN) 100 MG capsule Take 1 capsule (100 mg total) by mouth 2 (two) times daily. 10/29/17   Eustace Moore, MD  GuaiFENesin (MUCINEX PO) Take 2 tablets by mouth daily as needed. For congestion    [provider]  ipratropium (ATROVENT) 0.06 % nasal spray Place 2 sprays into both nostrils 4 (four) times daily. 10/09/14   Hayden Rasmussen, NP  predniSONE (DELTASONE) 20 MG tablet Take 1 tablet (20 mg total) by mouth 2 (two) times daily with a meal. 10/29/17   Eustace Moore, MD    Family History History reviewed. No pertinent family history.  Social History Social History   Tobacco Use  . Smoking status: Smoker, Current Status Unknown  Substance Use Topics  . Alcohol use: Yes  . Drug use: No     Allergies   Patient has no known allergies.   Review of Systems Review of Systems  Constitutional: Negative for chills and fever.  HENT: Positive for congestion, postnasal drip and sinus pressure. Negative for ear pain and  sore throat.   Eyes: Negative for pain and visual disturbance.  Respiratory: Positive for cough. Negative for shortness of breath and wheezing.   Cardiovascular: Negative for chest pain and palpitations.  Gastrointestinal: Negative for abdominal pain and vomiting.  Genitourinary: Negative for dysuria and hematuria.  Musculoskeletal: Negative for arthralgias and back pain.  Skin: Negative for color change and rash.  Neurological: Negative for seizures and syncope.  All other systems reviewed and are negative.    Physical Exam Triage Vital Signs ED Triage Vitals  Enc Vitals Group     BP 10/29/17 0936 108/68     Pulse Rate  10/29/17 0936 86     Resp --      Temp 10/29/17 0936 98.5 F (36.9 C)     Temp Source 10/29/17 0936 Oral     SpO2 10/29/17 0936 98 %   No data found.  Updated Vital Signs BP 108/68 (BP Location: Left Arm)   Pulse 86   Temp 98.5 F (36.9 C) (Oral)   SpO2 98%      Physical Exam  Constitutional: He appears well-developed and well-nourished. No distress.  HENT:  Head: Normocephalic and atraumatic.  Right Ear: External ear normal.  Left Ear: External ear normal.  Mouth/Throat: Oropharynx is clear and moist.  Eyes: Pupils are equal, round, and reactive to light. Conjunctivae are normal.  Neck: Normal range of motion. Neck supple.  Cardiovascular: Normal rate, regular rhythm and normal heart sounds.  Pulmonary/Chest: Effort normal. No respiratory distress.  Few anterior rhonchi.  No wheezes.  Abdominal: Soft. Bowel sounds are normal. He exhibits no distension.  Musculoskeletal: Normal range of motion. He exhibits no edema.  Lymphadenopathy:    He has no cervical adenopathy.  Neurological: He is alert.  Skin: Skin is warm and dry.  Psychiatric: He has a normal mood and affect. His behavior is normal.     UC Treatments / Results  Labs (all labs ordered are listed, but only abnormal results are displayed) Labs Reviewed - No data to display  EKG None  Radiology No results found.  Procedures Procedures (including critical care time)  Medications Ordered in UC Medications - No data to display  Initial Impression / Assessment and Plan / UC Course  I have reviewed the triage vital signs and the nursing notes.  Pertinent labs & imaging results that were available during my care of the patient were reviewed by me and considered in my medical decision making (see chart for details).     Discussed that this may be a respiratory virus.  Given his COPD and propensity towards respiratory infections, I believe it is wise to cover him with antibiotics and steroids.  Symptomatic  care discussed. Final Clinical Impressions(s) / UC Diagnoses   Final diagnoses:  COPD exacerbation (HCC)     Discharge Instructions     Take the antibiotic twice a day Take the prednisone as directed Drink plenty of fluids May continue Mucinex DM Return if not improving over the next several days Congratulations on your effort to quit smoking   ED Prescriptions    Medication Sig Dispense Auth. Provider   predniSONE (DELTASONE) 20 MG tablet Take 1 tablet (20 mg total) by mouth 2 (two) times daily with a meal. 10 tablet Eustace Moore, MD   doxycycline (VIBRAMYCIN) 100 MG capsule Take 1 capsule (100 mg total) by mouth 2 (two) times daily. 20 capsule Eustace Moore, MD     Controlled Substance Prescriptions [place] Controlled Substance Registry  consulted? Not Applicable   Eustace Moore, MD 10/29/17 1039

## 2017-10-29 NOTE — ED Triage Notes (Signed)
Pt reports nasal congestion and drainage with a sore throat x2 days.  Pt has been taking ibuprofen OTC with no relief.

## 2017-11-03 ENCOUNTER — Other Ambulatory Visit: Payer: Self-pay

## 2017-11-03 ENCOUNTER — Ambulatory Visit (HOSPITAL_COMMUNITY)
Admission: EM | Admit: 2017-11-03 | Discharge: 2017-11-03 | Disposition: A | Discharge status: Home / Self Care | Payer: BLUE CROSS/BLUE SHIELD | Attending: Family Medicine | Admitting: Family Medicine

## 2017-11-03 ENCOUNTER — Encounter (HOSPITAL_COMMUNITY): Payer: Self-pay

## 2017-11-03 DIAGNOSIS — R0982 Postnasal drip: Secondary | ICD-10-CM

## 2017-11-03 DIAGNOSIS — J441 Chronic obstructive pulmonary disease with (acute) exacerbation: Secondary | ICD-10-CM

## 2017-11-03 DIAGNOSIS — R0981 Nasal congestion: Secondary | ICD-10-CM | POA: Diagnosis not present

## 2017-11-03 MED ORDER — FLUTICASONE PROPIONATE 50 MCG/ACT NA SUSP: 2.0000 | Freq: Every day | NASAL | 0 refills | Status: AC

## 2017-11-03 MED ORDER — CETIRIZINE-PSEUDOEPHEDRINE ER 5-120 MG PO TB12: 1.0000 | ORAL_TABLET | Freq: Every day | ORAL | 0 refills | Status: AC

## 2017-11-03 NOTE — ED Triage Notes (Signed)
Pt c/o is nasal congestion and cough x 4 days.

## 2017-11-03 NOTE — ED Provider Notes (Signed)
[facility]   [id number] 11/03/17 Arrival Time: 0917  CC: Nasal congestion, post nasal drainage and cough  SUBJECTIVE:  Nicholas [name] is a 62 y.o. male hx significant for cardiac pacemaker and tobacco use (<1PPD x 30+ years), who presents with nasal congestion, PND, and cough x 5 days.  Was seen on 10/29/17 and treated for COPD exacerbation with doxycycline and prednisone.  Reports he is still having PND, and nasal congestion that is bothersome.  Cough is stable. Is compliant with antibiotic.  Denies aggravating or alleviating factors. Complains of associated fatigue, subjective fever, chills, sinus pressure, and rhinorrhea.   Denies sore throat, SOB, wheezing, chest pain, nausea, changes in bowel or bladder habits.    ROS: As per HPI.  Past Medical History:  Diagnosis Date  . Cardiac pacemaker in situ   . Exposure to noise   . Other specified cardiac dysrhythmias(427.89)    bradycardia  . Personal history of tobacco use, presenting hazards to health   . PTSD (post-traumatic stress disorder)   . Spondylosis of unspecified site without mention of myelopathy   . Unspecified mastoiditis   . Unspecified otitis media    Past Surgical History:  Procedure Laterality Date  . GENERATOR REMOVAL N/A 08/12/2011   Procedure: GENERATOR REMOVAL;  Surgeon: Marinus Maw, MD;  Location: [facility] CATH LAB;  Service: Cardiovascular;  Laterality: N/A;  . INNER EAR SURGERY    . permanent pacemaker     No Known Allergies No current facility-administered medications on file prior to encounter.    Current Outpatient Medications on File Prior to Encounter  Medication Sig Dispense Refill  . albuterol (PROVENTIL HFA;VENTOLIN HFA) 108 (90 BASE) MCG/ACT inhaler Inhale 2 puffs into the lungs every 4 (four) hours as needed for wheezing or shortness of breath. 1 Inhaler 0  . doxycycline (VIBRAMYCIN) 100 MG capsule Take 1 capsule (100 mg total) by mouth 2 (two) times daily. 20 capsule 0  . GuaiFENesin  (MUCINEX PO) Take 2 tablets by mouth daily as needed. For congestion    . ibuprofen (ADVIL,MOTRIN) 600 MG tablet Take 1 tablet (600 mg total) by mouth every 8 (eight) hours as needed for pain. 15 tablet 0    Social History   Socioeconomic History  . Marital status: Married    Spouse name: Not on file  . Number of children: Not on file  . Years of education: Not on file  . Highest education level: Not on file  Occupational History  . Not on file  Social Needs  . Financial resource strain: Not on file  . Food insecurity:    Worry: Not on file    Inability: Not on file  . Transportation needs:    Medical: Not on file    Non-medical: Not on file  Tobacco Use  . Smoking status: Smoker, Current Status Unknown  . Smokeless tobacco: Never Used  Substance and Sexual Activity  . Alcohol use: Yes  . Drug use: No  . Sexual activity: Not on file  Lifestyle  . Physical activity:    Days per week: Not on file    Minutes per session: Not on file  . Stress: Not on file  Relationships  . Social connections:    Talks on phone: Not on file    Gets together: Not on file    Attends religious service: Not on file    Active member of club or organization: Not on file    Attends meetings of clubs or organizations:  Not on file    Relationship status: Not on file  . Intimate partner violence:    Fear of current or ex partner: Not on file    Emotionally abused: Not on file    Physically abused: Not on file    Forced sexual activity: Not on file  Other Topics Concern  . Not on file  Social History Narrative   Married, does not get regular exercise. 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 Has a HS education.     History reviewed. No pertinent family history.   OBJECTIVE:  Vitals:   11/03/17 0949 11/03/17 0950  BP: 119/77   Pulse: 80   Resp: 18   Temp: 97.8 F (36.6 C)   TempSrc: Oral   SpO2: 98%   Weight:  135 lb (61.2 kg)     General appearance:  Alert; nontoxic appearance HEENT: NCAT; Ears: EACs clear, TMs pearly gray; Eyes: PERRL.  EOM grossly intact.  Nose: no obvious rhinorrhea; tonsils nonerythematous, uvula midline  Lungs: clear to auscultation bilaterally without adventitious breath sounds Heart: regular rate and rhythm.  Radial pulses 2+ symmetrical bilaterally Skin: warm and dry Psychological: alert and cooperative; normal mood and affect  ASSESSMENT & PLAN:  1. Nasal congestion   2. Post-nasal drainage   3. COPD exacerbation (HCC)     Meds ordered this encounter  Medications  . cetirizine-pseudoephedrine (ZYRTEC-D) 5-120 MG tablet    Sig: Take 1 tablet by mouth daily.    Dispense:  20 tablet    Refill:  0    Order Specific Question:   Supervising Provider    Answer:   Isa Rankin [phone]  . fluticasone (FLONASE) 50 MCG/ACT nasal spray    Sig: Place 2 sprays into both nostrils daily.    Dispense:  16 g    Refill:  0    Order Specific Question:   Supervising Provider    Answer:   Isa Rankin [[id number]]    Get plenty of rest and push fluids Continue with doxycycline.  Take as directed and to completion Zyrtec D prescribed.  Take daily for symptomatic relief Flonase prescribed.  Use as directed for symptomatic relief Consider taking mucinex to help thin your secretions.  This can be purchased over the counter.   Return or follow up with PCP or with [facility] if symptoms persist Return or go to ER if you have any new or worsening symptoms   Reviewed expectations re: course of current medical issues. Questions answered. Outlined signs and symptoms indicating need for more acute intervention. Patient verbalized understanding. After Visit Summary given.          Rennis Harding, PA-C 11/03/17 1042

## 2017-11-03 NOTE — Discharge Instructions (Addendum)
Get plenty of rest and push fluids Continue with doxycycline.  Take as directed and to completion Zyrtec D prescribed.  Take daily for symptomatic relief Flonase prescribed.  Use as directed for symptomatic relief Consider taking mucinex to help thin your secretions.  This can be purchased over the counter.   Return or follow up with PCP or with [facility] if symptoms persist Return or go to ER if you have any new or worsening symptoms

## 2018-07-11 ENCOUNTER — Other Ambulatory Visit: Payer: Self-pay | Admitting: Geriatric Medicine

## 2018-07-11 DIAGNOSIS — R634 Abnormal weight loss: Secondary | ICD-10-CM | POA: Diagnosis not present

## 2018-07-11 DIAGNOSIS — R05 Cough: Secondary | ICD-10-CM

## 2018-07-11 DIAGNOSIS — Z125 Encounter for screening for malignant neoplasm of prostate: Secondary | ICD-10-CM | POA: Diagnosis not present

## 2018-07-11 DIAGNOSIS — E7800 Pure hypercholesterolemia, unspecified: Secondary | ICD-10-CM | POA: Diagnosis not present

## 2018-07-11 DIAGNOSIS — F17210 Nicotine dependence, cigarettes, uncomplicated: Secondary | ICD-10-CM | POA: Diagnosis not present

## 2018-07-11 DIAGNOSIS — R059 Cough, unspecified: Secondary | ICD-10-CM

## 2018-07-11 DIAGNOSIS — Z79899 Other long term (current) drug therapy: Secondary | ICD-10-CM | POA: Diagnosis not present

## 2018-07-18 ENCOUNTER — Ambulatory Visit
Admission: RE | Admit: 2018-07-18 | Discharge: 2018-07-18 | Disposition: A | Discharge status: Home / Self Care | Payer: BC Managed Care – PPO | Source: Ambulatory Visit | Attending: Geriatric Medicine | Admitting: Geriatric Medicine

## 2018-07-18 ENCOUNTER — Other Ambulatory Visit: Payer: Self-pay

## 2018-07-18 DIAGNOSIS — R059 Cough, unspecified: Secondary | ICD-10-CM

## 2018-07-18 DIAGNOSIS — R05 Cough: Secondary | ICD-10-CM

## 2018-07-18 DIAGNOSIS — J439 Emphysema, unspecified: Secondary | ICD-10-CM | POA: Diagnosis not present

## 2018-07-18 DIAGNOSIS — R911 Solitary pulmonary nodule: Secondary | ICD-10-CM | POA: Diagnosis not present

## 2018-07-18 MED ORDER — IOPAMIDOL (ISOVUE-300) INJECTION 61%
75.0000 mL | Freq: Once | INTRAVENOUS | Status: AC | PRN
  Administered 2018-07-18: 75 mL via INTRAVENOUS

## 2018-08-08 DIAGNOSIS — F17210 Nicotine dependence, cigarettes, uncomplicated: Secondary | ICD-10-CM | POA: Diagnosis not present

## 2018-08-31 DIAGNOSIS — R5383 Other fatigue: Secondary | ICD-10-CM | POA: Diagnosis not present

## 2018-08-31 DIAGNOSIS — F17210 Nicotine dependence, cigarettes, uncomplicated: Secondary | ICD-10-CM | POA: Diagnosis not present

## 2018-08-31 DIAGNOSIS — F320 Major depressive disorder, single episode, mild: Secondary | ICD-10-CM | POA: Diagnosis not present

## 2018-09-02 ENCOUNTER — Other Ambulatory Visit (HOSPITAL_COMMUNITY): Payer: Self-pay | Admitting: Geriatric Medicine

## 2018-09-02 DIAGNOSIS — R5383 Other fatigue: Secondary | ICD-10-CM

## 2018-09-05 ENCOUNTER — Ambulatory Visit (HOSPITAL_COMMUNITY): Payer: BC Managed Care – PPO | Attending: Internal Medicine

## 2018-09-05 ENCOUNTER — Other Ambulatory Visit: Payer: Self-pay

## 2018-09-05 DIAGNOSIS — R5383 Other fatigue: Secondary | ICD-10-CM | POA: Diagnosis not present

## 2018-09-20 DIAGNOSIS — Z01818 Encounter for other preprocedural examination: Secondary | ICD-10-CM | POA: Diagnosis not present

## 2018-10-14 DIAGNOSIS — Z1159 Encounter for screening for other viral diseases: Secondary | ICD-10-CM | POA: Diagnosis not present

## 2018-10-19 DIAGNOSIS — Z1211 Encounter for screening for malignant neoplasm of colon: Secondary | ICD-10-CM | POA: Diagnosis not present

## 2018-10-19 DIAGNOSIS — D123 Benign neoplasm of transverse colon: Secondary | ICD-10-CM | POA: Diagnosis not present

## 2018-10-19 DIAGNOSIS — K5730 Diverticulosis of large intestine without perforation or abscess without bleeding: Secondary | ICD-10-CM | POA: Diagnosis not present

## 2018-11-04 DIAGNOSIS — F320 Major depressive disorder, single episode, mild: Secondary | ICD-10-CM | POA: Diagnosis not present

## 2018-11-04 DIAGNOSIS — Z23 Encounter for immunization: Secondary | ICD-10-CM | POA: Diagnosis not present

## 2018-11-04 DIAGNOSIS — F17210 Nicotine dependence, cigarettes, uncomplicated: Secondary | ICD-10-CM | POA: Diagnosis not present

## 2019-01-11 DIAGNOSIS — Z20828 Contact with and (suspected) exposure to other viral communicable diseases: Secondary | ICD-10-CM | POA: Diagnosis not present

## 2019-01-17 DIAGNOSIS — F17210 Nicotine dependence, cigarettes, uncomplicated: Secondary | ICD-10-CM | POA: Diagnosis not present

## 2019-01-17 DIAGNOSIS — F320 Major depressive disorder, single episode, mild: Secondary | ICD-10-CM | POA: Diagnosis not present

## 2019-01-17 DIAGNOSIS — R0981 Nasal congestion: Secondary | ICD-10-CM | POA: Diagnosis not present

## 2019-02-22 DIAGNOSIS — H6981 Other specified disorders of Eustachian tube, right ear: Secondary | ICD-10-CM | POA: Diagnosis not present

## 2019-02-22 DIAGNOSIS — H9011 Conductive hearing loss, unilateral, right ear, with unrestricted hearing on the contralateral side: Secondary | ICD-10-CM | POA: Diagnosis not present

## 2019-04-05 DIAGNOSIS — J310 Chronic rhinitis: Secondary | ICD-10-CM | POA: Diagnosis not present

## 2019-04-05 DIAGNOSIS — H9011 Conductive hearing loss, unilateral, right ear, with unrestricted hearing on the contralateral side: Secondary | ICD-10-CM | POA: Diagnosis not present

## 2019-04-05 DIAGNOSIS — H6983 Other specified disorders of Eustachian tube, bilateral: Secondary | ICD-10-CM | POA: Diagnosis not present

## 2019-04-05 DIAGNOSIS — J343 Hypertrophy of nasal turbinates: Secondary | ICD-10-CM | POA: Diagnosis not present

## 2019-07-20 ENCOUNTER — Other Ambulatory Visit: Payer: Self-pay | Admitting: Geriatric Medicine

## 2019-07-20 DIAGNOSIS — R911 Solitary pulmonary nodule: Secondary | ICD-10-CM

## 2019-07-25 ENCOUNTER — Ambulatory Visit
Admission: RE | Admit: 2019-07-25 | Discharge: 2019-07-25 | Disposition: A | Discharge status: Home / Self Care | Payer: BC Managed Care – PPO | Source: Ambulatory Visit | Attending: Geriatric Medicine | Admitting: Geriatric Medicine

## 2019-07-25 DIAGNOSIS — R911 Solitary pulmonary nodule: Secondary | ICD-10-CM

## 2019-07-31 DIAGNOSIS — Z23 Encounter for immunization: Secondary | ICD-10-CM | POA: Diagnosis not present

## 2019-07-31 DIAGNOSIS — Z125 Encounter for screening for malignant neoplasm of prostate: Secondary | ICD-10-CM | POA: Diagnosis not present

## 2019-07-31 DIAGNOSIS — Z79899 Other long term (current) drug therapy: Secondary | ICD-10-CM | POA: Diagnosis not present

## 2019-07-31 DIAGNOSIS — E7800 Pure hypercholesterolemia, unspecified: Secondary | ICD-10-CM | POA: Diagnosis not present

## 2019-07-31 DIAGNOSIS — F331 Major depressive disorder, recurrent, moderate: Secondary | ICD-10-CM | POA: Diagnosis not present

## 2019-07-31 DIAGNOSIS — Z0000 Encounter for general adult medical examination without abnormal findings: Secondary | ICD-10-CM | POA: Diagnosis not present

## 2019-07-31 DIAGNOSIS — Z1159 Encounter for screening for other viral diseases: Secondary | ICD-10-CM | POA: Diagnosis not present

## 2019-08-29 DIAGNOSIS — F331 Major depressive disorder, recurrent, moderate: Secondary | ICD-10-CM | POA: Diagnosis not present

## 2019-10-09 DIAGNOSIS — Z23 Encounter for immunization: Secondary | ICD-10-CM | POA: Diagnosis not present

## 2019-10-09 DIAGNOSIS — F320 Major depressive disorder, single episode, mild: Secondary | ICD-10-CM | POA: Diagnosis not present

## 2019-10-09 DIAGNOSIS — G4440 Drug-induced headache, not elsewhere classified, not intractable: Secondary | ICD-10-CM | POA: Diagnosis not present

## 2020-01-22 DIAGNOSIS — M545 Low back pain, unspecified: Secondary | ICD-10-CM | POA: Diagnosis not present

## 2020-07-31 DIAGNOSIS — Z125 Encounter for screening for malignant neoplasm of prostate: Secondary | ICD-10-CM | POA: Diagnosis not present

## 2020-07-31 DIAGNOSIS — Z0000 Encounter for general adult medical examination without abnormal findings: Secondary | ICD-10-CM | POA: Diagnosis not present

## 2020-07-31 DIAGNOSIS — E7800 Pure hypercholesterolemia, unspecified: Secondary | ICD-10-CM | POA: Diagnosis not present

## 2020-07-31 DIAGNOSIS — Z79899 Other long term (current) drug therapy: Secondary | ICD-10-CM | POA: Diagnosis not present

## 2020-09-06 ENCOUNTER — Other Ambulatory Visit: Payer: Self-pay | Admitting: Geriatric Medicine

## 2020-09-06 DIAGNOSIS — R911 Solitary pulmonary nodule: Secondary | ICD-10-CM

## 2020-09-26 ENCOUNTER — Ambulatory Visit
Admission: RE | Admit: 2020-09-26 | Discharge: 2020-09-26 | Disposition: A | Discharge status: Home / Self Care | Payer: BC Managed Care – PPO | Source: Ambulatory Visit | Attending: Geriatric Medicine | Admitting: Geriatric Medicine

## 2020-09-26 DIAGNOSIS — R911 Solitary pulmonary nodule: Secondary | ICD-10-CM | POA: Diagnosis not present

## 2020-12-23 DIAGNOSIS — F325 Major depressive disorder, single episode, in full remission: Secondary | ICD-10-CM | POA: Diagnosis not present

## 2020-12-23 DIAGNOSIS — R69 Illness, unspecified: Secondary | ICD-10-CM | POA: Diagnosis not present

## 2021-04-09 DIAGNOSIS — M5441 Lumbago with sciatica, right side: Secondary | ICD-10-CM | POA: Diagnosis not present

## 2021-04-09 DIAGNOSIS — R69 Illness, unspecified: Secondary | ICD-10-CM | POA: Diagnosis not present

## 2021-05-02 DIAGNOSIS — J329 Chronic sinusitis, unspecified: Secondary | ICD-10-CM | POA: Diagnosis not present

## 2021-08-06 DIAGNOSIS — Z79899 Other long term (current) drug therapy: Secondary | ICD-10-CM | POA: Diagnosis not present

## 2021-08-06 DIAGNOSIS — Z0000 Encounter for general adult medical examination without abnormal findings: Secondary | ICD-10-CM | POA: Diagnosis not present

## 2021-08-06 DIAGNOSIS — R911 Solitary pulmonary nodule: Secondary | ICD-10-CM | POA: Diagnosis not present

## 2021-08-06 DIAGNOSIS — Z23 Encounter for immunization: Secondary | ICD-10-CM | POA: Diagnosis not present

## 2021-08-06 DIAGNOSIS — E7800 Pure hypercholesterolemia, unspecified: Secondary | ICD-10-CM | POA: Diagnosis not present

## 2021-08-06 DIAGNOSIS — R69 Illness, unspecified: Secondary | ICD-10-CM | POA: Diagnosis not present

## 2021-08-06 DIAGNOSIS — Z125 Encounter for screening for malignant neoplasm of prostate: Secondary | ICD-10-CM | POA: Diagnosis not present

## 2021-08-07 ENCOUNTER — Other Ambulatory Visit: Payer: Self-pay | Admitting: Geriatric Medicine

## 2021-08-07 DIAGNOSIS — R911 Solitary pulmonary nodule: Secondary | ICD-10-CM

## 2021-08-19 ENCOUNTER — Ambulatory Visit
Admission: RE | Admit: 2021-08-19 | Discharge: 2021-08-19 | Disposition: A | Discharge status: Home / Self Care | Payer: Medicare HMO | Source: Ambulatory Visit | Attending: Geriatric Medicine | Admitting: Geriatric Medicine

## 2021-08-19 DIAGNOSIS — R911 Solitary pulmonary nodule: Secondary | ICD-10-CM | POA: Diagnosis not present

## 2021-09-16 DIAGNOSIS — H5213 Myopia, bilateral: Secondary | ICD-10-CM | POA: Diagnosis not present

## 2021-10-11 DIAGNOSIS — Z0100 Encounter for examination of eyes and vision without abnormal findings: Secondary | ICD-10-CM | POA: Diagnosis not present

## 2022-03-01 IMAGING — CT CT CHEST W/O CM
2 of 4 series · 11 of 36 positions shown, 13 images · non-contrast
Comparison: July 18, 2018.

CLINICAL DATA: Lung nodule.

EXAM:
CT CHEST WITHOUT CONTRAST
TECHNIQUE: Multidetector CT imaging of the chest was performed following the
standard protocol without IV contrast.

[Series 2: chest 2.00 br40 s3 · axial · 0.47mm/px · z∈[+1386,+1714]mm · 8 of 192 slices shown, 10 images (1 of 2)]
[im 14/192  mediastinal]
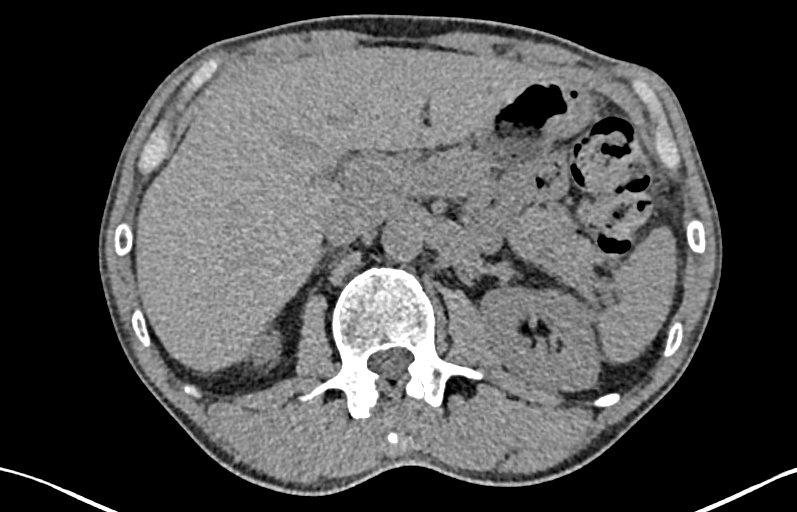
[im 14/192  lung]
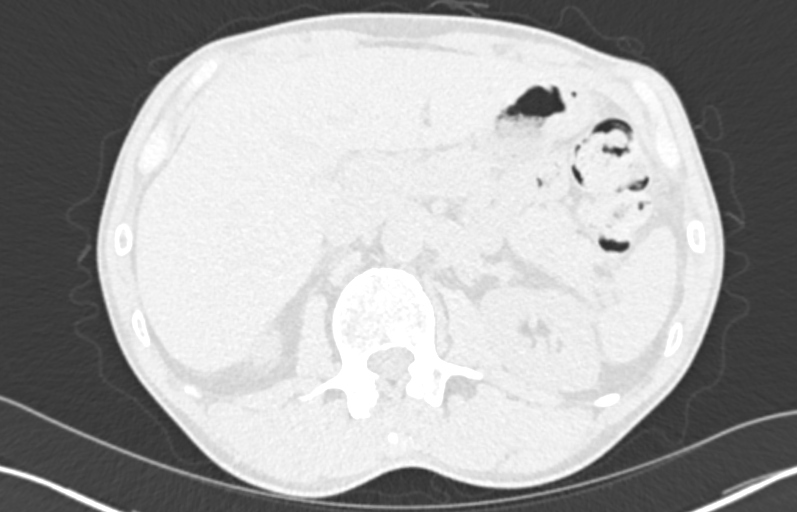
[im 41/192  lung]
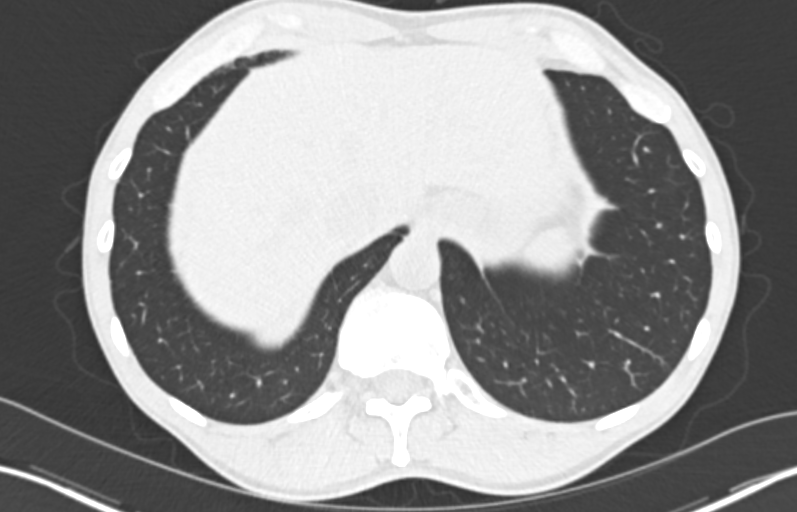
[im 69/192  lung]
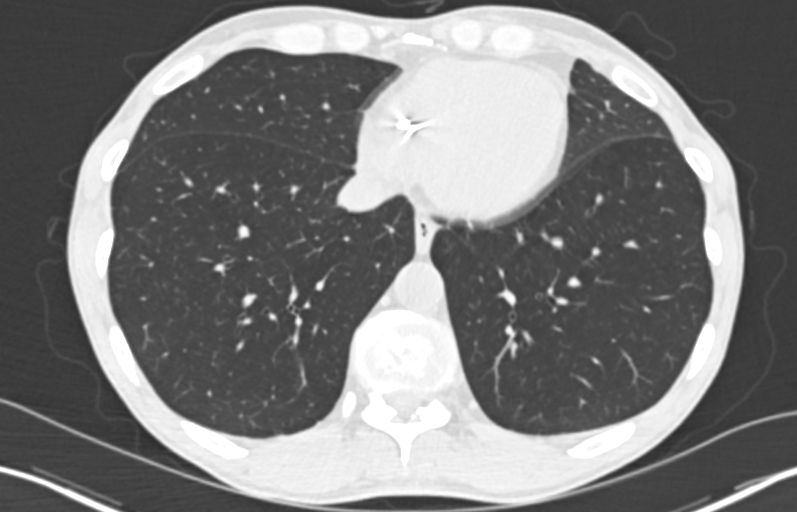
[im 82/192  lung]
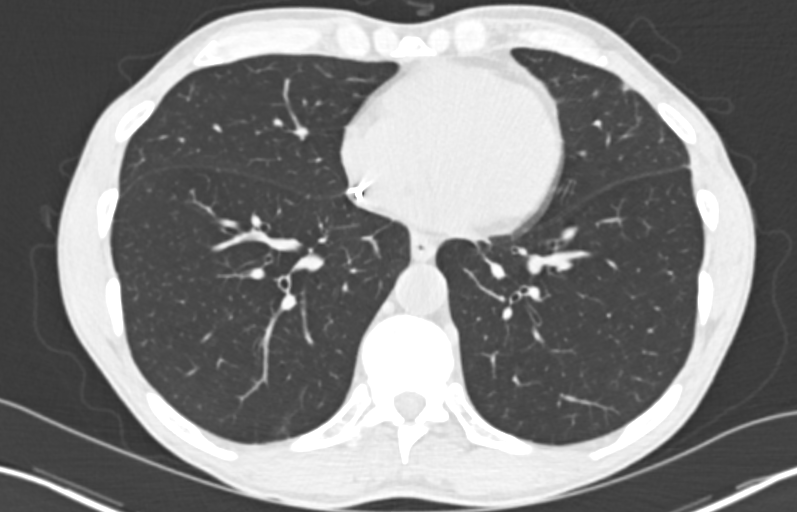
[im 110/192  mediastinal]
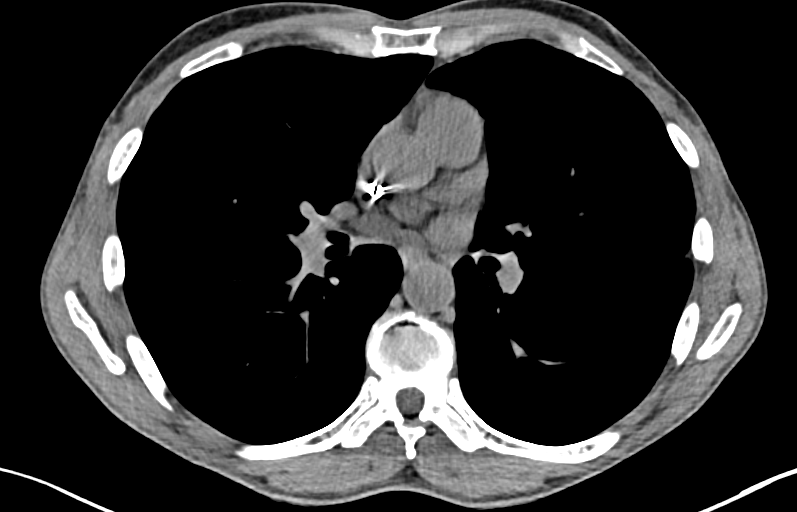
[im 110/192  lung]
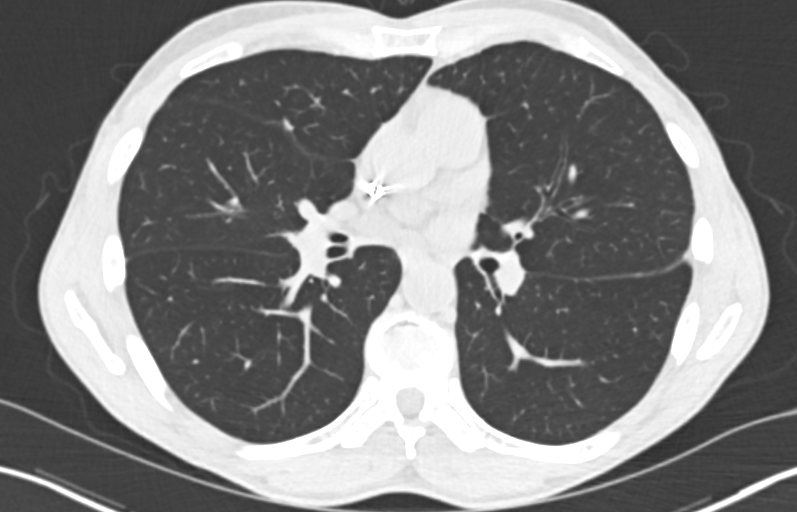
[im 123/192  lung]
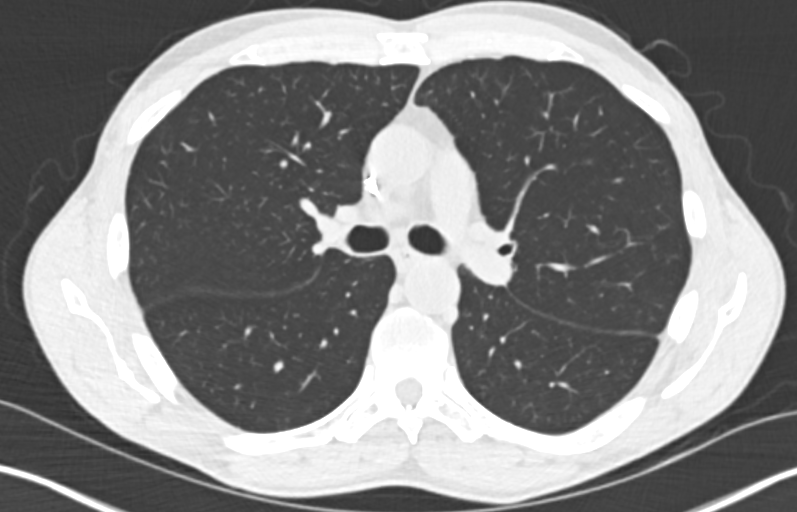
[im 151/192  lung]
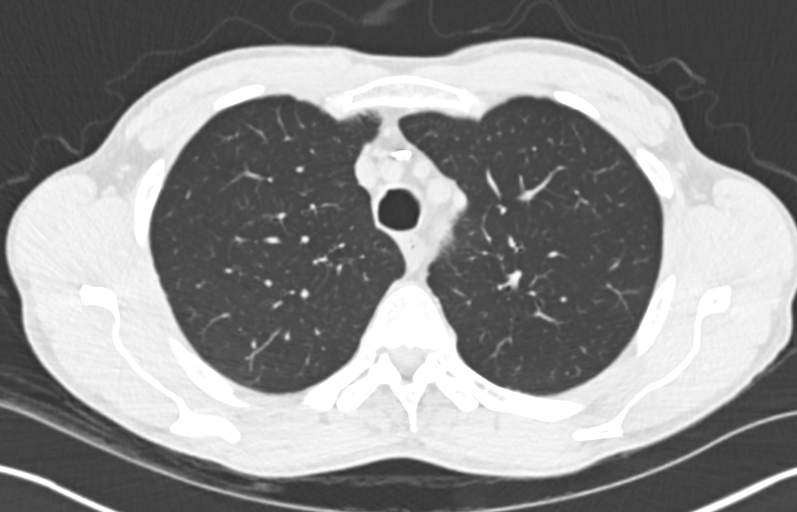
[im 178/192  lung]
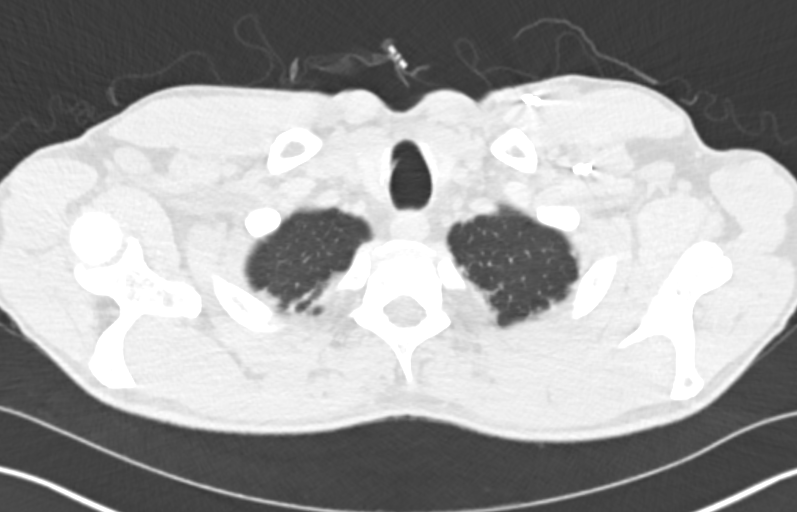

[Series 4: chest 2.00 br40 s3 · coronal · 0.74mm/px · 3 of 121 slices shown (2 of 2)]
[im 25/121  lung]
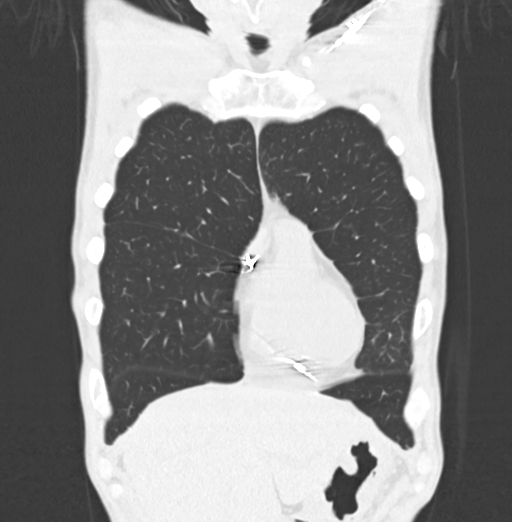
[im 49/121  lung]
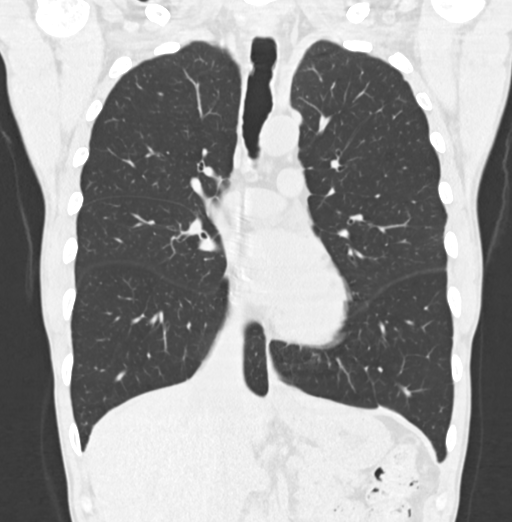
[im 73/121  lung]
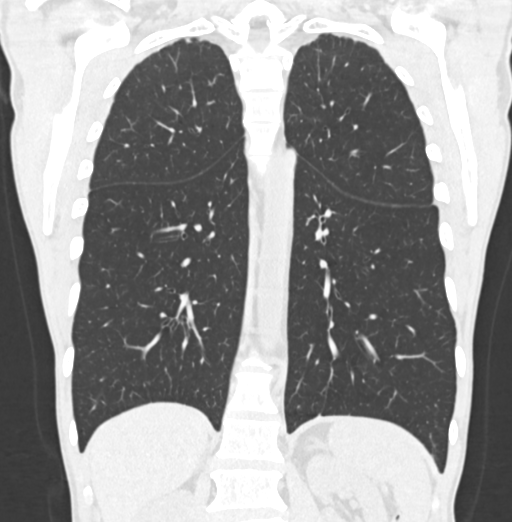

[11 of 36 positions shown; findings below may reference images not displayed]

FINDINGS: Cardiovascular: No significant vascular findings. Normal heart size.
No pericardial effusion.

Mediastinum/Nodes: No enlarged mediastinal or axillary lymph nodes.
Thyroid gland, trachea, and esophagus demonstrate no significant
findings.

Lungs/Pleura: No pneumothorax or pleural effusion is noted. Mild
biapical scarring is noted. Grossly stable 7 mm subpleural nodule is
seen posteriorly in the right lower lobe best seen on image number
134 of series 8. No consolidative process is noted.

Upper Abdomen: No acute abnormality.

Musculoskeletal: No chest wall mass or suspicious bone lesions
identified.
IMPRESSION: Grossly stable 7 mm subpleural nodule seen posteriorly in the right
lower lobe. Follow-up unenhanced chest CT in 12 months is
recommended to ensure stability.

## 2022-08-12 ENCOUNTER — Other Ambulatory Visit: Payer: Self-pay | Admitting: Internal Medicine

## 2022-08-12 DIAGNOSIS — Z79899 Other long term (current) drug therapy: Secondary | ICD-10-CM | POA: Diagnosis not present

## 2022-08-12 DIAGNOSIS — R911 Solitary pulmonary nodule: Secondary | ICD-10-CM

## 2022-08-12 DIAGNOSIS — J439 Emphysema, unspecified: Secondary | ICD-10-CM | POA: Diagnosis not present

## 2022-08-12 DIAGNOSIS — Z125 Encounter for screening for malignant neoplasm of prostate: Secondary | ICD-10-CM | POA: Diagnosis not present

## 2022-08-12 DIAGNOSIS — Z0000 Encounter for general adult medical examination without abnormal findings: Secondary | ICD-10-CM | POA: Diagnosis not present

## 2022-08-12 DIAGNOSIS — Z1211 Encounter for screening for malignant neoplasm of colon: Secondary | ICD-10-CM | POA: Diagnosis not present

## 2022-08-12 DIAGNOSIS — H9211 Otorrhea, right ear: Secondary | ICD-10-CM | POA: Diagnosis not present

## 2022-08-12 DIAGNOSIS — F325 Major depressive disorder, single episode, in full remission: Secondary | ICD-10-CM | POA: Diagnosis not present

## 2022-08-12 DIAGNOSIS — F17210 Nicotine dependence, cigarettes, uncomplicated: Secondary | ICD-10-CM | POA: Diagnosis not present

## 2022-08-12 DIAGNOSIS — E7800 Pure hypercholesterolemia, unspecified: Secondary | ICD-10-CM | POA: Diagnosis not present

## 2022-09-01 ENCOUNTER — Ambulatory Visit
Admission: RE | Admit: 2022-09-01 | Discharge: 2022-09-01 | Disposition: A | Discharge status: Home / Self Care | Payer: Medicare HMO | Source: Ambulatory Visit | Attending: Internal Medicine | Admitting: Internal Medicine

## 2022-09-01 DIAGNOSIS — R911 Solitary pulmonary nodule: Secondary | ICD-10-CM

## 2022-09-01 DIAGNOSIS — R918 Other nonspecific abnormal finding of lung field: Secondary | ICD-10-CM | POA: Diagnosis not present

## 2023-05-04 IMAGING — CT CT CHEST W/O CM
2 of 4 series · 12 of 36 positions shown, 15 images · non-contrast
Comparison: [DATE] [DATE], [DATE].  [DATE] [DATE], [DATE].

CLINICAL DATA: Lung nodule.

EXAM:
CT CHEST WITHOUT CONTRAST
TECHNIQUE: Multidetector CT imaging of the chest was performed following the
standard protocol without IV contrast.

[Series 2: chest 2.00 br40 s3 · axial · 0.52mm/px · z∈[+1355,+1691]mm · 9 of 198 slices shown, 12 images (1 of 2)]
[im 15/198  mediastinal]
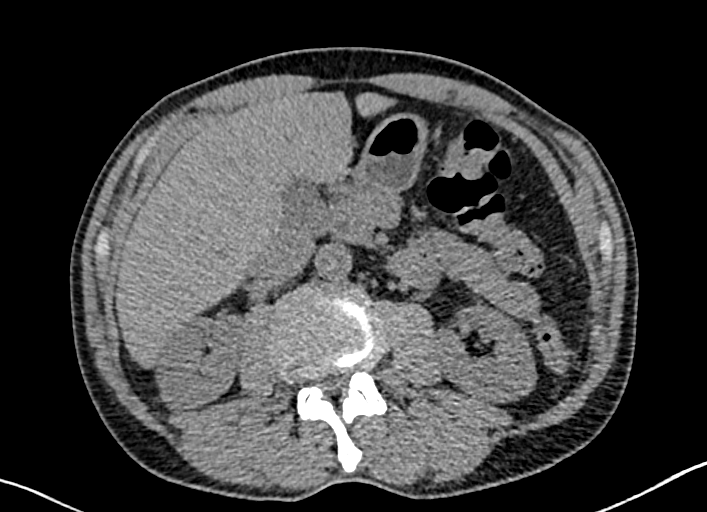
[im 15/198  lung]
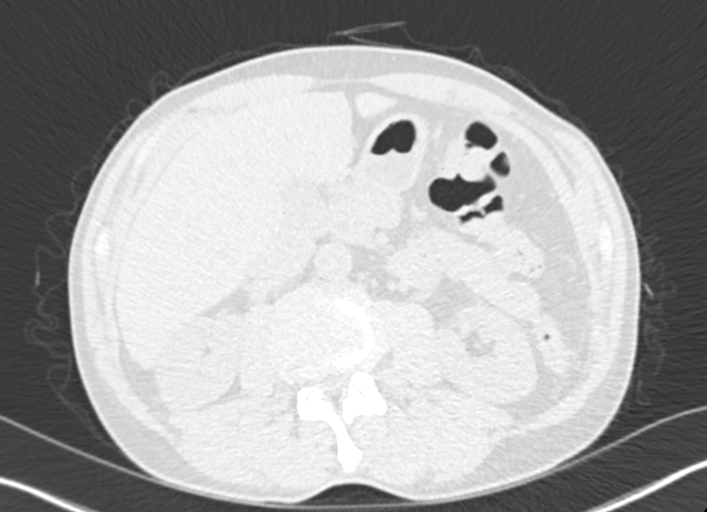
[im 43/198  lung]
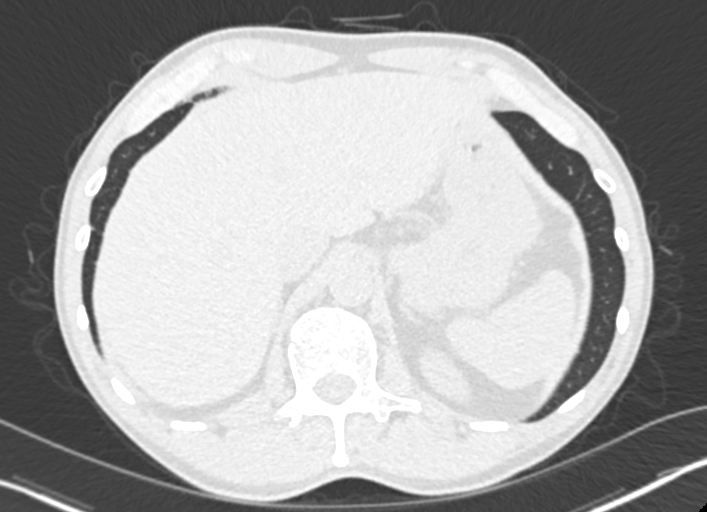
[im 57/198  lung]
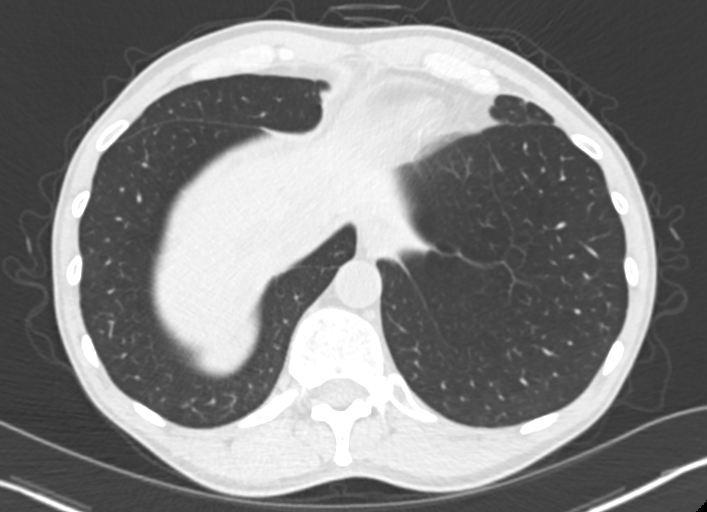
[im 85/198  lung]
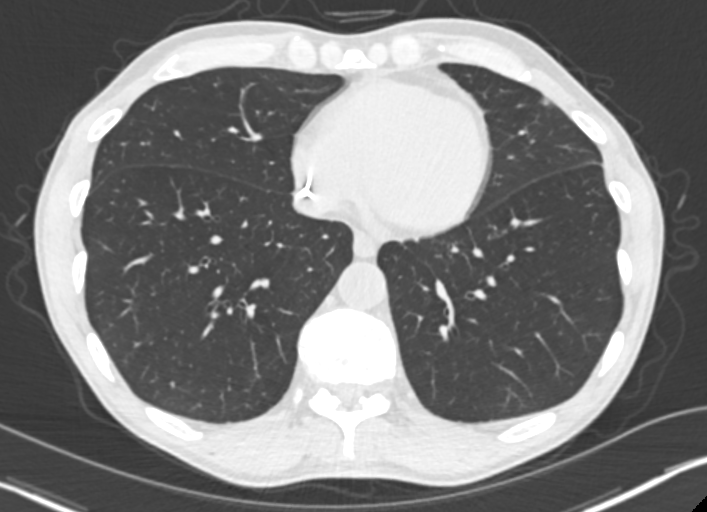
[im 99/198  mediastinal]
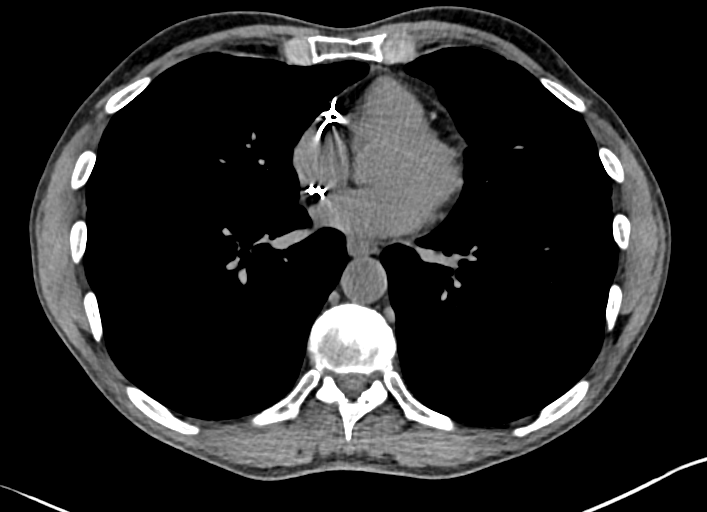
[im 99/198  lung]
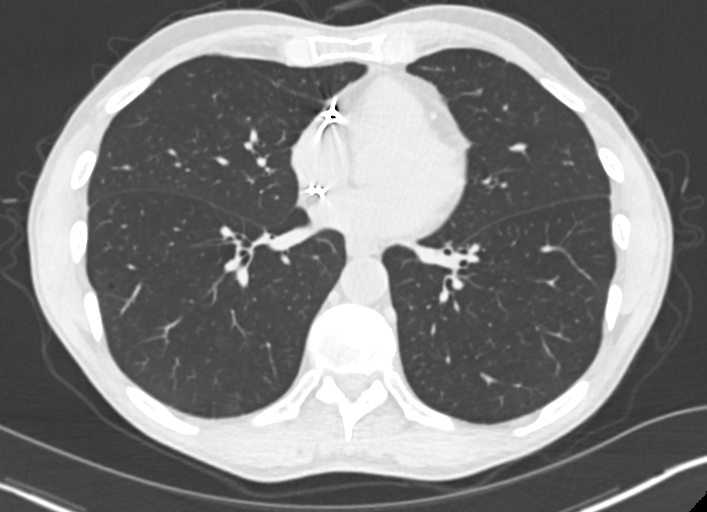
[im 113/198  lung]
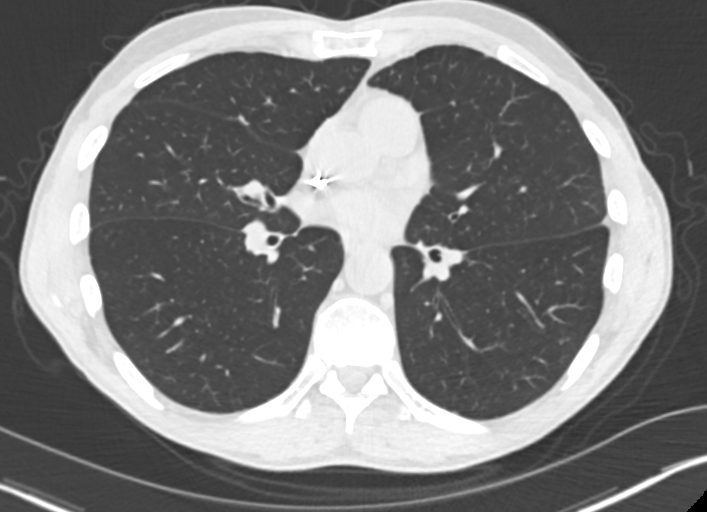
[im 141/198  lung]
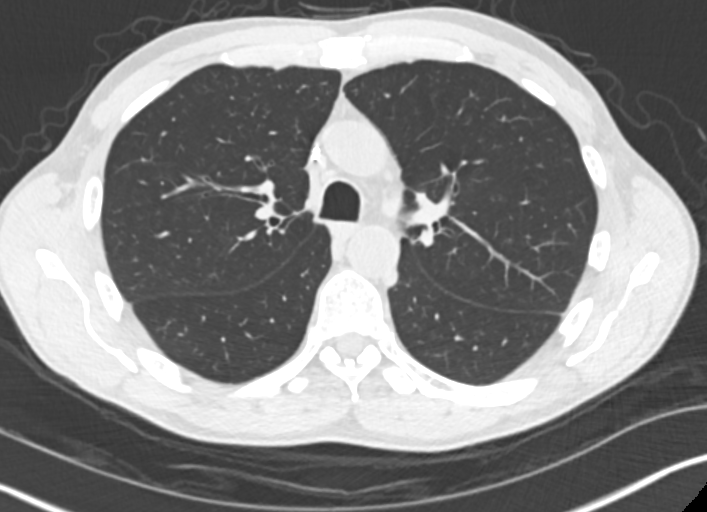
[im 155/198  lung]
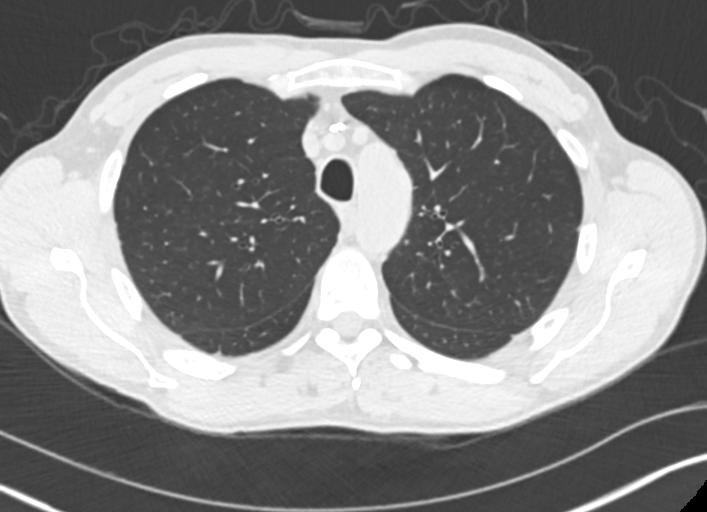
[im 183/198  mediastinal]
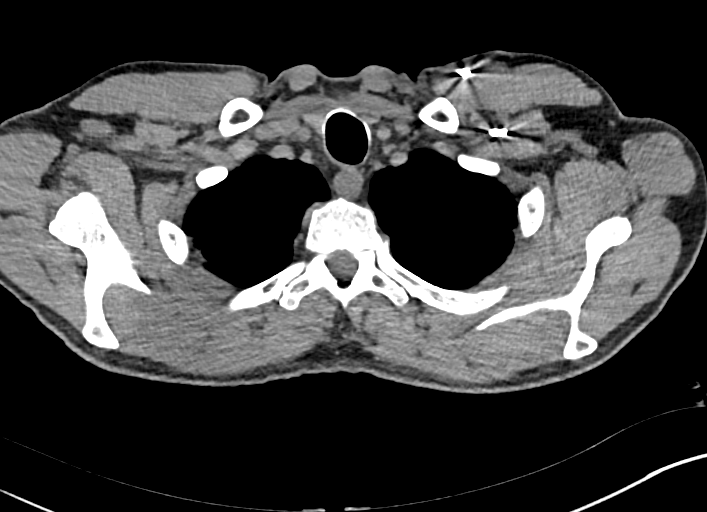
[im 183/198  lung]
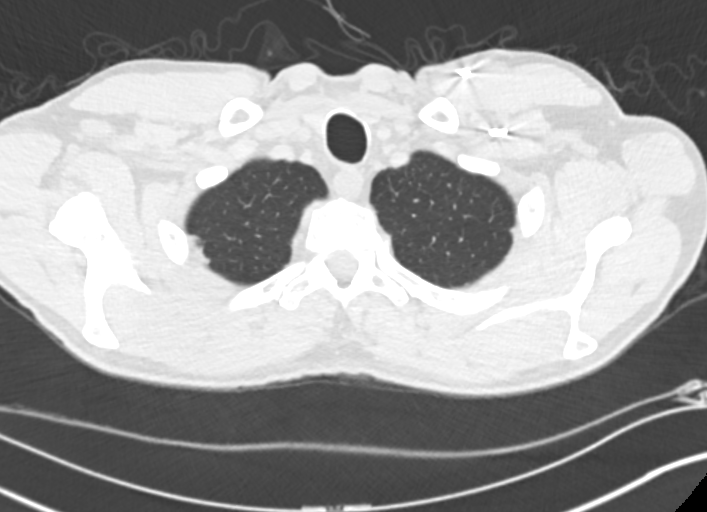

[Series 4: chest 2.00 br40 s3 · coronal · 0.72mm/px · 3 of 133 slices shown (2 of 2)]
[im 27/133  lung]
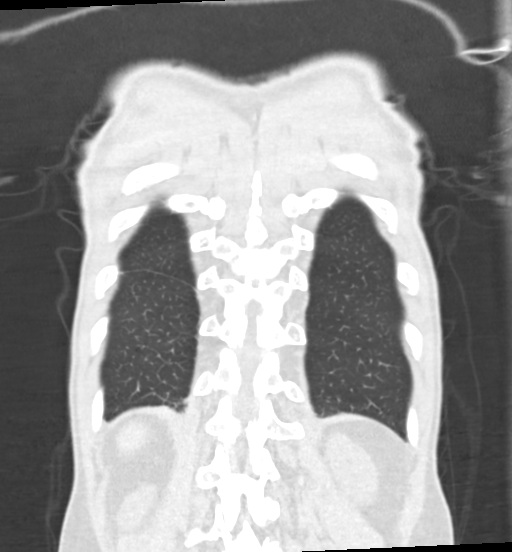
[im 53/133  lung]
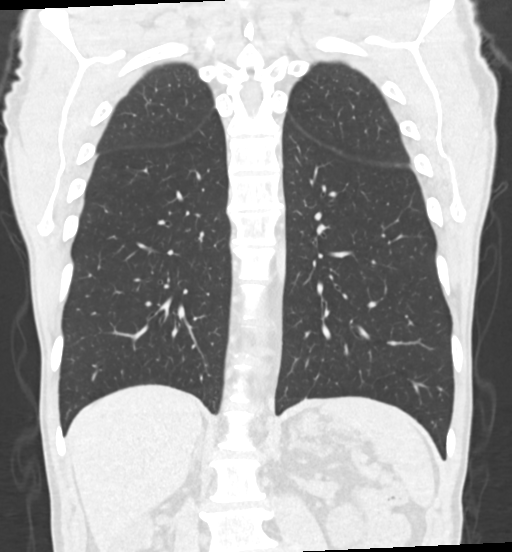
[im 80/133  lung]
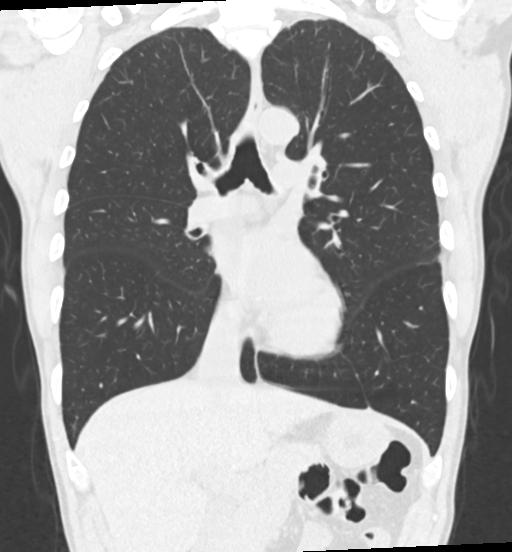

[12 of 36 positions shown; findings below may reference images not displayed]

FINDINGS: Cardiovascular: No significant vascular findings. Normal heart size.
No pericardial effusion. Left-sided cardiac device in situ is again
noted.

Mediastinum/Nodes: No enlarged mediastinal or axillary lymph nodes.
Thyroid gland, trachea, and esophagus demonstrate no significant
findings.

Lungs/Pleura: No pneumothorax or pleural effusion is noted. Mild
biapical scarring is noted. Stable 7 mm subpleural nodule is noted
posteriorly in the right lower lobe best seen on image number 121 of
series 8.

Upper Abdomen: No acute abnormality.

Musculoskeletal: No chest wall mass or suspicious bone lesions
identified.
IMPRESSION: Stable 7 mm subpleural nodule is noted posteriorly in the right
lower lobe. This can be considered benign at this point with no
further follow-up required.

## 2023-08-13 DIAGNOSIS — Z79899 Other long term (current) drug therapy: Secondary | ICD-10-CM | POA: Diagnosis not present

## 2023-08-13 DIAGNOSIS — E7800 Pure hypercholesterolemia, unspecified: Secondary | ICD-10-CM | POA: Diagnosis not present

## 2023-08-13 DIAGNOSIS — Z125 Encounter for screening for malignant neoplasm of prostate: Secondary | ICD-10-CM | POA: Diagnosis not present

## 2023-08-13 DIAGNOSIS — Z0000 Encounter for general adult medical examination without abnormal findings: Secondary | ICD-10-CM | POA: Diagnosis not present

## 2023-08-13 DIAGNOSIS — F325 Major depressive disorder, single episode, in full remission: Secondary | ICD-10-CM | POA: Diagnosis not present

## 2023-08-13 DIAGNOSIS — J439 Emphysema, unspecified: Secondary | ICD-10-CM | POA: Diagnosis not present

## 2023-08-16 ENCOUNTER — Other Ambulatory Visit: Payer: Self-pay | Admitting: Internal Medicine

## 2023-08-16 DIAGNOSIS — Z122 Encounter for screening for malignant neoplasm of respiratory organs: Secondary | ICD-10-CM

## 2023-08-25 ENCOUNTER — Ambulatory Visit
Admission: RE | Admit: 2023-08-25 | Discharge: 2023-08-25 | Disposition: A | Discharge status: Home / Self Care | Source: Ambulatory Visit | Attending: Internal Medicine | Admitting: Internal Medicine

## 2023-08-25 DIAGNOSIS — Z122 Encounter for screening for malignant neoplasm of respiratory organs: Secondary | ICD-10-CM

## 2023-08-25 DIAGNOSIS — F17210 Nicotine dependence, cigarettes, uncomplicated: Secondary | ICD-10-CM | POA: Diagnosis not present

## 2023-12-27 DIAGNOSIS — H01011 Ulcerative blepharitis right upper eyelid: Secondary | ICD-10-CM | POA: Diagnosis not present
